# Patient Record
Sex: Female | Born: 1980
Health system: Southern US, Community
[De-identification: ages and names within clinical notes are randomized; demographics above are authoritative.]

## PROBLEM LIST (undated history)

## (undated) ENCOUNTER — Inpatient Hospital Stay (HOSPITAL_COMMUNITY): Payer: BC Managed Care – PPO

## (undated) DIAGNOSIS — R519 Headache, unspecified: Secondary | ICD-10-CM

## (undated) DIAGNOSIS — C801 Malignant (primary) neoplasm, unspecified: Secondary | ICD-10-CM

## (undated) DIAGNOSIS — Z8619 Personal history of other infectious and parasitic diseases: Secondary | ICD-10-CM

## (undated) DIAGNOSIS — R51 Headache: Secondary | ICD-10-CM

## (undated) DIAGNOSIS — R87629 Unspecified abnormal cytological findings in specimens from vagina: Secondary | ICD-10-CM

## (undated) HISTORY — DX: Headache, unspecified: R51.9

## (undated) HISTORY — DX: Headache: R51

## (undated) HISTORY — DX: Personal history of other infectious and parasitic diseases: Z86.19

## (undated) HISTORY — DX: Unspecified abnormal cytological findings in specimens from vagina: R87.629

---

## 2002-10-02 HISTORY — PX: PILONIDAL CYST EXCISION: SHX744

## 2003-04-09 ENCOUNTER — Ambulatory Visit (HOSPITAL_COMMUNITY): Admission: RE | Admit: 2003-04-09 | Discharge: 2003-04-09 | Payer: Self-pay | Admitting: General Surgery

## 2013-12-31 LAB — OB RESULTS CONSOLE HIV ANTIBODY (ROUTINE TESTING): HIV: NONREACTIVE

## 2013-12-31 LAB — OB RESULTS CONSOLE GC/CHLAMYDIA
Chlamydia: NEGATIVE
Gonorrhea: NEGATIVE

## 2013-12-31 LAB — OB RESULTS CONSOLE ABO/RH: "RH Type ": POSITIVE

## 2013-12-31 LAB — OB RESULTS CONSOLE ANTIBODY SCREEN: ANTIBODY SCREEN: NEGATIVE

## 2013-12-31 LAB — OB RESULTS CONSOLE HEPATITIS B SURFACE ANTIGEN: Hepatitis B Surface Ag: NEGATIVE

## 2013-12-31 LAB — OB RESULTS CONSOLE RPR: RPR: NONREACTIVE

## 2013-12-31 LAB — OB RESULTS CONSOLE RUBELLA ANTIBODY, IGM: Rubella: IMMUNE

## 2014-04-15 ENCOUNTER — Inpatient Hospital Stay (HOSPITAL_COMMUNITY): Admission: AD | Admit: 2014-04-15 | Payer: Self-pay | Source: Ambulatory Visit | Admitting: Obstetrics and Gynecology

## 2014-08-20 ENCOUNTER — Encounter (HOSPITAL_COMMUNITY): Payer: Self-pay | Admitting: *Deleted

## 2014-08-20 ENCOUNTER — Telehealth (HOSPITAL_COMMUNITY): Payer: Self-pay | Admitting: *Deleted

## 2014-08-20 LAB — OB RESULTS CONSOLE GBS: STREP GROUP B AG: NEGATIVE

## 2014-08-20 NOTE — Telephone Encounter (Signed)
Preadmission screen  

## 2014-08-21 ENCOUNTER — Encounter (HOSPITAL_COMMUNITY): Payer: Self-pay

## 2014-08-21 ENCOUNTER — Inpatient Hospital Stay (HOSPITAL_COMMUNITY)
Admission: RE | Admit: 2014-08-21 | Discharge: 2014-08-24 | DRG: 775 | Disposition: A | Discharge status: Home / Self Care | Payer: BC Managed Care – PPO | Source: Ambulatory Visit | Attending: Obstetrics and Gynecology | Admitting: Obstetrics and Gynecology

## 2014-08-21 DIAGNOSIS — Z3490 Encounter for supervision of normal pregnancy, unspecified, unspecified trimester: Secondary | ICD-10-CM | POA: Diagnosis present

## 2014-08-21 DIAGNOSIS — Z3A40 40 weeks gestation of pregnancy: Secondary | ICD-10-CM | POA: Diagnosis present

## 2014-08-21 LAB — CBC
HCT: 34.8 % — ABNORMAL LOW (ref 36.0–46.0)
Hemoglobin: 11.8 g/dL — ABNORMAL LOW (ref 12.0–15.0)
MCH: 33 pg (ref 26.0–34.0)
MCHC: 33.9 g/dL (ref 30.0–36.0)
MCV: 97.2 fL (ref 78.0–100.0)
Platelets: 193 10*3/uL (ref 150–400)
RBC: 3.58 MIL/uL — ABNORMAL LOW (ref 3.87–5.11)
RDW: 13.2 % (ref 11.5–15.5)
WBC: 9.3 10*3/uL (ref 4.0–10.5)

## 2014-08-21 LAB — TYPE AND SCREEN
ABO/RH(D): A POS
Antibody Screen: NEGATIVE

## 2014-08-21 LAB — OB RESULTS CONSOLE ABO/RH: RH Type: POSITIVE

## 2014-08-21 MED ORDER — LIDOCAINE HCL (PF) 1 % IJ SOLN
30.0000 mL | INTRAMUSCULAR | Status: DC | PRN
  Filled 2014-08-21: qty 30

## 2014-08-21 MED ORDER — CITRIC ACID-SODIUM CITRATE 334-500 MG/5ML PO SOLN: 30.0000 mL | ORAL | Status: DC | PRN

## 2014-08-21 MED ORDER — OXYTOCIN 40 UNITS IN LACTATED RINGERS INFUSION - SIMPLE MED: 62.5000 mL/h | INTRAVENOUS | Status: DC

## 2014-08-21 MED ORDER — OXYCODONE-ACETAMINOPHEN 5-325 MG PO TABS
1.0000 | ORAL_TABLET | ORAL | Status: DC | PRN
Start: 2014-08-21 — End: 2014-08-22

## 2014-08-21 MED ORDER — MISOPROSTOL 25 MCG QUARTER TABLET
25.0000 ug | ORAL_TABLET | ORAL | Status: DC | PRN
  Administered 2014-08-21: 25 ug via VAGINAL
  Filled 2014-08-21: qty 1
  Filled 2014-08-21: qty 0.25

## 2014-08-21 MED ORDER — ZOLPIDEM TARTRATE 5 MG PO TABS
5.0000 mg | ORAL_TABLET | Freq: Every evening | ORAL | Status: DC | PRN
  Filled 2014-08-21: qty 1

## 2014-08-21 MED ORDER — SODIUM CHLORIDE 0.9 % IJ SOLN
3.0000 mL | INTRAMUSCULAR | Status: DC | PRN
  Administered 2014-08-21 – 2014-08-22 (×2): 3 mL via INTRAVENOUS
  Filled 2014-08-21 (×2): qty 3

## 2014-08-21 MED ORDER — ACETAMINOPHEN 325 MG PO TABS: 650.0000 mg | ORAL_TABLET | ORAL | Status: DC | PRN

## 2014-08-21 MED ORDER — LACTATED RINGERS IV SOLN
INTRAVENOUS | Status: DC
Start: 2014-08-21 — End: 2014-08-22
  Administered 2014-08-21 – 2014-08-22 (×2): via INTRAVENOUS

## 2014-08-21 MED ORDER — TERBUTALINE SULFATE 1 MG/ML IJ SOLN
0.2500 mg | Freq: Once | INTRAMUSCULAR | Status: AC | PRN
Start: 2014-08-21 — End: 2014-08-21

## 2014-08-21 MED ORDER — OXYTOCIN BOLUS FROM INFUSION
500.0000 mL | INTRAVENOUS | Status: DC
  Administered 2014-08-22: 500 mL via INTRAVENOUS

## 2014-08-21 MED ORDER — OXYCODONE-ACETAMINOPHEN 5-325 MG PO TABS: 2.0000 | ORAL_TABLET | ORAL | Status: DC | PRN

## 2014-08-21 MED ORDER — LACTATED RINGERS IV SOLN: 500.0000 mL | INTRAVENOUS | Status: DC | PRN

## 2014-08-21 MED ORDER — BUTORPHANOL TARTRATE 1 MG/ML IJ SOLN
1.0000 mg | INTRAMUSCULAR | Status: DC | PRN
  Administered 2014-08-22 (×2): 1 mg via INTRAVENOUS
  Filled 2014-08-21 (×2): qty 1

## 2014-08-21 MED ORDER — FLEET ENEMA 7-19 GM/118ML RE ENEM: 1.0000 | ENEMA | Freq: Every day | RECTAL | Status: DC | PRN

## 2014-08-21 MED ORDER — ONDANSETRON HCL 4 MG/2ML IJ SOLN: 4.0000 mg | Freq: Four times a day (QID) | INTRAMUSCULAR | Status: DC | PRN

## 2014-08-21 NOTE — Plan of Care (Signed)
Problem: Phase I Progression Outcomes Goal: Assess per MD/Nurse,Routine-VS,FHR,UC,Head to Toe assess Outcome: Completed/Met Date Met:  08/21/14 Goal: Obtain and review prenatal records Outcome: Completed/Met Date Met:  08/21/14 Goal: Pain controlled with appropriate interventions Outcome: Completed/Met Date Met:  08/21/14 Goal: OOB as tolerated unless otherwise ordered Outcome: Completed/Met Date Met:  08/21/14 Goal: Tolerating diet Outcome: Completed/Met Date Met:  08/21/14 Goal: Adequate progression of labor Outcome: Completed/Met Date Met:  08/21/14 Goal: Medications/IV Fluids N/A Outcome: Completed/Met Date Met:  08/21/14 Goal: Induction meds as ordered Outcome: Completed/Met Date Met:  08/21/14 Goal: Appropriate patient level of comfort Outcome: Completed/Met Date Met:  08/21/14 Goal: Medical plan of care initiated within 2 hrs of admission Outcome: Completed/Met Date Met:  08/21/14 Goal: Other Phase I Outcomes/Goals Outcome: Completed/Met Date Met:  08/21/14

## 2014-08-22 ENCOUNTER — Encounter (HOSPITAL_COMMUNITY): Payer: Self-pay

## 2014-08-22 ENCOUNTER — Inpatient Hospital Stay (HOSPITAL_COMMUNITY): Payer: BC Managed Care – PPO | Admitting: Anesthesiology

## 2014-08-22 LAB — RPR

## 2014-08-22 LAB — ABO/RH: ABO/RH(D): A POS

## 2014-08-22 MED ORDER — MEDROXYPROGESTERONE ACETATE 150 MG/ML IM SUSP: 150.0000 mg | INTRAMUSCULAR | Status: DC | PRN

## 2014-08-22 MED ORDER — OXYCODONE-ACETAMINOPHEN 5-325 MG PO TABS: 2.0000 | ORAL_TABLET | ORAL | Status: DC | PRN

## 2014-08-22 MED ORDER — ONDANSETRON HCL 4 MG/2ML IJ SOLN: 4.0000 mg | INTRAMUSCULAR | Status: DC | PRN

## 2014-08-22 MED ORDER — DIPHENHYDRAMINE HCL 50 MG/ML IJ SOLN: 12.5000 mg | INTRAMUSCULAR | Status: DC | PRN

## 2014-08-22 MED ORDER — LANOLIN HYDROUS EX OINT
TOPICAL_OINTMENT | CUTANEOUS | Status: DC | PRN
Start: 2014-08-22 — End: 2014-08-24

## 2014-08-22 MED ORDER — TETANUS-DIPHTH-ACELL PERTUSSIS 5-2.5-18.5 LF-MCG/0.5 IM SUSP: 0.5000 mL | Freq: Once | INTRAMUSCULAR | Status: DC

## 2014-08-22 MED ORDER — OXYTOCIN 40 UNITS IN LACTATED RINGERS INFUSION - SIMPLE MED
1.0000 m[IU]/min | INTRAVENOUS | Status: DC
  Administered 2014-08-22: 2 m[IU]/min via INTRAVENOUS
  Filled 2014-08-22: qty 1000

## 2014-08-22 MED ORDER — DIBUCAINE 1 % RE OINT: 1.0000 "application " | TOPICAL_OINTMENT | RECTAL | Status: DC | PRN

## 2014-08-22 MED ORDER — PHENYLEPHRINE 40 MCG/ML (10ML) SYRINGE FOR IV PUSH (FOR BLOOD PRESSURE SUPPORT)
80.0000 ug | PREFILLED_SYRINGE | INTRAVENOUS | Status: DC | PRN
  Filled 2014-08-22: qty 2

## 2014-08-22 MED ORDER — FENTANYL 2.5 MCG/ML BUPIVACAINE 1/10 % EPIDURAL INFUSION (WH - ANES)
14.0000 mL/h | INTRAMUSCULAR | Status: DC | PRN
  Administered 2014-08-22: 14 mL/h via EPIDURAL
  Filled 2014-08-22: qty 125

## 2014-08-22 MED ORDER — LIDOCAINE-EPINEPHRINE (PF) 2 %-1:200000 IJ SOLN
INTRAMUSCULAR | Status: DC | PRN
  Administered 2014-08-22: 3 mL

## 2014-08-22 MED ORDER — SIMETHICONE 80 MG PO CHEW: 80.0000 mg | CHEWABLE_TABLET | ORAL | Status: DC | PRN

## 2014-08-22 MED ORDER — PHENYLEPHRINE 40 MCG/ML (10ML) SYRINGE FOR IV PUSH (FOR BLOOD PRESSURE SUPPORT)
80.0000 ug | PREFILLED_SYRINGE | INTRAVENOUS | Status: DC | PRN
  Filled 2014-08-22: qty 10
  Filled 2014-08-22: qty 2

## 2014-08-22 MED ORDER — SENNOSIDES-DOCUSATE SODIUM 8.6-50 MG PO TABS
2.0000 | ORAL_TABLET | ORAL | Status: DC
  Administered 2014-08-23 (×2): 2 via ORAL
  Filled 2014-08-22 (×2): qty 2

## 2014-08-22 MED ORDER — ONDANSETRON HCL 4 MG PO TABS: 4.0000 mg | ORAL_TABLET | ORAL | Status: DC | PRN

## 2014-08-22 MED ORDER — OXYCODONE-ACETAMINOPHEN 5-325 MG PO TABS
1.0000 | ORAL_TABLET | ORAL | Status: DC | PRN
  Administered 2014-08-23 – 2014-08-24 (×3): 1 via ORAL
  Filled 2014-08-22 (×3): qty 1

## 2014-08-22 MED ORDER — IBUPROFEN 600 MG PO TABS
600.0000 mg | ORAL_TABLET | Freq: Four times a day (QID) | ORAL | Status: DC
  Administered 2014-08-22 – 2014-08-24 (×7): 600 mg via ORAL
  Filled 2014-08-22 (×7): qty 1

## 2014-08-22 MED ORDER — DIPHENHYDRAMINE HCL 25 MG PO CAPS: 25.0000 mg | ORAL_CAPSULE | Freq: Four times a day (QID) | ORAL | Status: DC | PRN

## 2014-08-22 MED ORDER — EPHEDRINE 5 MG/ML INJ
10.0000 mg | INTRAVENOUS | Status: DC | PRN
  Filled 2014-08-22: qty 2

## 2014-08-22 MED ORDER — WITCH HAZEL-GLYCERIN EX PADS
1.0000 "application " | MEDICATED_PAD | CUTANEOUS | Status: DC | PRN
  Administered 2014-08-24: 1 via TOPICAL

## 2014-08-22 MED ORDER — MEASLES, MUMPS & RUBELLA VAC ~~LOC~~ INJ: 0.5000 mL | INJECTION | Freq: Once | SUBCUTANEOUS | Status: DC

## 2014-08-22 MED ORDER — BUPIVACAINE HCL (PF) 0.25 % IJ SOLN
INTRAMUSCULAR | Status: DC | PRN
  Administered 2014-08-22 (×2): 4 mL via EPIDURAL

## 2014-08-22 MED ORDER — BENZOCAINE-MENTHOL 20-0.5 % EX AERO
1.0000 "application " | INHALATION_SPRAY | CUTANEOUS | Status: DC | PRN
  Administered 2014-08-24: 1 via TOPICAL
  Filled 2014-08-22: qty 56

## 2014-08-22 MED ORDER — PRENATAL MULTIVITAMIN CH
1.0000 | ORAL_TABLET | Freq: Every day | ORAL | Status: DC
  Administered 2014-08-23: 1 via ORAL
  Filled 2014-08-22: qty 1

## 2014-08-22 MED ORDER — LACTATED RINGERS IV SOLN
500.0000 mL | Freq: Once | INTRAVENOUS | Status: AC
  Administered 2014-08-22: 500 mL via INTRAVENOUS

## 2014-08-22 MED ORDER — EPHEDRINE 5 MG/ML INJ
10.0000 mg | INTRAVENOUS | Status: DC | PRN
Start: 2014-08-22 — End: 2014-08-22
  Filled 2014-08-22: qty 2

## 2014-08-22 NOTE — Progress Notes (Signed)
Pt pushed great but was exhausted after 2.5hrs Offered vacuum assist vs primary c-section R/b/a of both d/w pt and husband including cephalohematoma, shoulder Dystocia, and failed attempt leading to c-section. Questions answered, informed consent. Bell vacuum applied and 2 gentle pulls performed  With next 2 pushes (pop off x 2).  After second pop off, episiotomy was cut  And vertex delivered spontaneously with next push. Infant placed on mom's chest and cord clamped and cut Placenta delivered spontaneous w/ 3VC.   2nd degree episiotomy repaired w/ 3-0 vicryl rapide.  Fundus firm.  EBL 400cc .

## 2014-08-22 NOTE — Progress Notes (Signed)
Pt uncomfortable - wants epidural  FHT reassuring Toco Q2-4 cvx 5cm/C/-2  A/P:  Continue pitocin Exp mngt

## 2014-08-22 NOTE — Plan of Care (Signed)
Problem: Consults Goal: Postpartum Patient Education (See Patient Education module for education specifics.)  Outcome: Completed/Met Date Met:  08/22/14 Goal: Skin Care Protocol Initiated - if Braden Score 18 or less If consults are not indicated, leave blank or document N/A  Outcome: Completed/Met Date Met:  08/22/14  Problem: Phase I Progression Outcomes Goal: Pain controlled with appropriate interventions Outcome: Completed/Met Date Met:  08/22/14 Goal: Voiding adequately Outcome: Completed/Met Date Met:  08/22/14 Goal: OOB as tolerated unless otherwise ordered Outcome: Completed/Met Date Met:  08/22/14 Goal: VS, stable, temp < 100.4 degrees F Outcome: Completed/Met Date Met:  08/22/14 Goal: Initial discharge plan identified Outcome: Completed/Met Date Met:  08/22/14 Goal: Other Phase I Outcomes/Goals Outcome: Completed/Met Date Met:  08/22/14  Problem: Phase II Progression Outcomes Goal: Pain controlled on oral analgesia Outcome: Completed/Met Date Met:  08/22/14

## 2014-08-22 NOTE — Progress Notes (Signed)
Pt comfortable with epidural  FHT reassuring w/ occasional variable/early decel Toco Q3 Cvx 8-9/C/0 Attempted IUPC but unable to insert completely b/c of advanced dilation/station  A/P:  Exp mngt

## 2014-08-22 NOTE — Anesthesia Procedure Notes (Signed)
Epidural Patient location during procedure: OB  Staffing Anesthesiologist: Fenna Semel, CHRIS Performed by: anesthesiologist   Preanesthetic Checklist Completed: patient identified, surgical consent, pre-op evaluation, timeout performed, IV checked, risks and benefits discussed and monitors and equipment checked  Epidural Patient position: sitting Prep: site prepped and draped and DuraPrep Patient monitoring: heart rate, cardiac monitor, continuous pulse ox and blood pressure Approach: midline Location: L3-L4 Injection technique: LOR saline  Needle:  Needle type: Tuohy  Needle gauge: 17 G Needle length: 9 cm Needle insertion depth: 5 cm Catheter type: closed end flexible Catheter size: 19 Gauge Catheter at skin depth: 12 cm Test dose: negative and 2% lidocaine with Epi 1:200 K  Assessment Events: blood not aspirated, injection not painful, no injection resistance, negative IV test and no paresthesia  Additional Notes H+P and labs checked, risks and benefits discussed with the patient, consent obtained, procedure tolerated well and without complications.  Reason for block:procedure for pain

## 2014-08-22 NOTE — Plan of Care (Signed)
Problem: Phase II Progression Outcomes Goal: Tolerating diet Outcome: Completed/Met Date Met:  08/22/14

## 2014-08-22 NOTE — Anesthesia Preprocedure Evaluation (Signed)
Anesthesia Evaluation  Patient identified by MRN, date of birth, ID band Patient awake    Reviewed: Allergy & Precautions, H&P , NPO status , Patient's Chart, lab work & pertinent test results  History of Anesthesia Complications Negative for: history of anesthetic complications  Airway Mallampati: II  TM Distance: >3 FB Neck ROM: Full    Dental  (+) Teeth Intact   Pulmonary neg pulmonary ROS,  breath sounds clear to auscultation        Cardiovascular negative cardio ROS  Rhythm:Regular     Neuro/Psych  Headaches, negative psych ROS   GI/Hepatic negative GI ROS, Neg liver ROS,   Endo/Other  negative endocrine ROS  Renal/GU negative Renal ROS     Musculoskeletal   Abdominal   Peds  Hematology  (+) anemia ,   Anesthesia Other Findings   Reproductive/Obstetrics                             Anesthesia Physical Anesthesia Plan  ASA: II  Anesthesia Plan: Epidural   Post-op Pain Management:    Induction:   Airway Management Planned:   Additional Equipment:   Intra-op Plan:   Post-operative Plan:   Informed Consent: I have reviewed the patients History and Physical, chart, labs and discussed the procedure including the risks, benefits and alternatives for the proposed anesthesia with the patient or authorized representative who has indicated his/her understanding and acceptance.   Dental advisory given  Plan Discussed with: Anesthesiologist  Anesthesia Plan Comments:         Anesthesia Quick Evaluation

## 2014-08-22 NOTE — H&P (Signed)
Lori Best is a 33 y.o. female G1P0 @ 40+1 presenting for postdates IOL.  No vb or lof. Pregnancy uncomplicated.  Pt received one dose cytotec overnight  History OB History    Gravida Para Term Preterm AB TAB SAB Ectopic Multiple Living   1              Past Medical History  Diagnosis Date  . Vaginal Pap smear, abnormal   . Hx of varicella   . Headache     migraines   Past Surgical History  Procedure Laterality Date  . Pilonidal cyst excision  2004   Family History: family history includes Cancer in her mother and paternal grandmother; Hypertension in her father and paternal grandmother; Kidney Stones in her mother. Social History:  reports that she has never smoked. She has never used smokeless tobacco. She reports that she does not drink alcohol or use illicit drugs.   Prenatal Transfer Tool  Maternal Diabetes: No Genetic Screening: Normal Maternal Ultrasounds/Referrals: Normal Fetal Ultrasounds or other Referrals:  None Maternal Substance Abuse:  No Significant Maternal Medications:  None Significant Maternal Lab Results:  None Other Comments:  None  ROS  Dilation: 3 Effacement (%): 60 Station: -2 Exam by:: Dr. Julien Girt Blood pressure 124/88, pulse 86, temperature 98.4 F (36.9 C), temperature source Oral, resp. rate 20, height 5\' 2"  (1.575 m), weight 76.204 kg (168 lb). Exam Physical Exam  gen - NAD Abd - gravid, NT,  EFW 7 1/2# Ext - NT, no edema Cvx 3/60/-2 AROM - clear  Prenatal labs: ABO, Rh: A/Positive/-- (11/20 2221) Antibody: NEG (11/20 1955) Rubella: Immune (04/01 0000) RPR: NON REAC (11/20 1955)  HBsAg: Negative (04/01 0000)  HIV: Non-reactive (04/01 0000)  GBS: Negative (11/19 0000)   Assessment/Plan: Admit Ext mngt Pitocin  Epidural prn   Khadeejah Castner 08/22/2014, 7:33 AM

## 2014-08-23 LAB — CBC
HEMATOCRIT: 29.8 % — AB (ref 36.0–46.0)
HEMOGLOBIN: 10.2 g/dL — AB (ref 12.0–15.0)
MCH: 33.4 pg (ref 26.0–34.0)
MCHC: 34.2 g/dL (ref 30.0–36.0)
MCV: 97.7 fL (ref 78.0–100.0)
Platelets: 174 10*3/uL (ref 150–400)
RBC: 3.05 MIL/uL — ABNORMAL LOW (ref 3.87–5.11)
RDW: 13.3 % (ref 11.5–15.5)
WBC: 14.5 10*3/uL — ABNORMAL HIGH (ref 4.0–10.5)

## 2014-08-23 NOTE — Progress Notes (Signed)
Post Partum Day 1 Subjective: no complaints, up ad lib and tolerating PO  Objective: Blood pressure 122/83, pulse 96, temperature 98.2 F (36.8 C), temperature source Oral, resp. rate 18, height 5\' 2"  (1.575 m), weight 76.204 kg (168 lb), SpO2 97 %, unknown if currently breastfeeding.  Physical Exam:  General: alert and cooperative Lochia: appropriate Uterine Fundus: firm Incision: n/a DVT Evaluation: No evidence of DVT seen on physical exam.   Recent Labs  08/21/14 1955 08/23/14 0557  HGB 11.8* 10.2*  HCT 34.8* 29.8*    Assessment/Plan: Plan for discharge tomorrow, Breastfeeding and Circumcision prior to discharge   LOS: 2 days   Lori Best 08/23/2014, 7:17 AM

## 2014-08-23 NOTE — Lactation Note (Signed)
This note was copied from the chart of [place]. Lactation Consultation Note  Patient Name: Lori Best [id number] Date: 08/23/2014 Reason for consult: Initial assessment of this mom and baby, now 27 hours postpartum.  Mom is a primipara and is exclusively breastfeeding her newborn.  LC demonstrated hand expression, and reviewed and encouraged STS and cue feedings.  Mom has just finished a 10 minute feeding.  LATCH scores of 8 and 9 have been recorded by RN assessment.  Baby already having more than minimum output.  Mom encouraged to feed baby 8-12 times/24 hours and with feeding cues. LC encouraged review of Baby and Me pp 9, 14 and 20-25 for STS and BF information. LC provided [employer] brochure and reviewed [facility] services and list of community and web site resources.    Maternal Data Formula Feeding for Exclusion: No Has patient been taught Hand Expression?:  (LC demonstrated and mom says she did " a lot of reading to prepare for BF") Does the patient have breastfeeding experience prior to this delivery?: No  Feeding Feeding Type: Breast Fed Length of feed: 17 min  LATCH Score/Interventions Latch: Grasps breast easily, tongue down, lips flanged, rhythmical sucking.  Audible Swallowing: A few with stimulation Intervention(s): Hand expression;Skin to skin  Type of Nipple: Everted at rest and after stimulation  Comfort (Breast/Nipple): Soft / non-tender     Hold (Positioning): No assistance needed to correctly position infant at breast.  LATCH Score: 9 (most recent assessment, per RN)  Lactation Tools Discussed/Used   STS, hand expression, cue feedings  Consult Status Consult Status: Follow-up Date: 08/24/14 Follow-up type: In-patient    Lori Best [facility] 08/23/2014, 10:15 PM

## 2014-08-23 NOTE — Anesthesia Postprocedure Evaluation (Signed)
  Anesthesia Post-op Note  Anesthesia Post Note  Patient: Lori Best  Procedure(s) Performed: * No procedures listed *  Anesthesia type: Epidural  Patient location: Mother/Baby  Post pain: Pain level controlled  Post assessment: Post-op Vital signs reviewed  Last Vitals:  Filed Vitals:   08/23/14 0503  BP: 122/83  Pulse: 96  Temp: 36.8 C  Resp: 18    Post vital signs: Reviewed  Level of consciousness:alert  Complications: No apparent anesthesia complications

## 2014-08-24 MED ORDER — OXYCODONE-ACETAMINOPHEN 5-325 MG PO TABS: 1.0000 | ORAL_TABLET | ORAL | Status: DC | PRN

## 2014-08-24 MED ORDER — IBUPROFEN 600 MG PO TABS: 600.0000 mg | ORAL_TABLET | Freq: Four times a day (QID) | ORAL | Status: DC

## 2014-08-24 NOTE — Discharge Summary (Signed)
Obstetric Discharge Summary Reason for Admission: induction of labor Prenatal Procedures: none Intrapartum Procedures: spontaneous vaginal delivery and vacuum Postpartum Procedures: none Complications-Operative and Postpartum: 2nd degree perineal laceration HEMOGLOBIN  Date Value Ref Range Status  08/23/2014 10.2* 12.0 - 15.0 g/dL Final   HCT  Date Value Ref Range Status  08/23/2014 29.8* 36.0 - 46.0 % Final    Physical Exam:  General: alert, cooperative and appears stated age 33: appropriate Uterine Fundus: firm Incision: healing well, no significant drainage, no dehiscence DVT Evaluation: No evidence of DVT seen on physical exam. Negative Homan's sign. No cords or calf tenderness.  Discharge Diagnoses: Term Pregnancy-delivered  Discharge Information: Date: 08/24/2014 Activity: pelvic rest Diet: routine Medications: PNV, Ibuprofen and Percocet Condition: stable Instructions: refer to practice specific booklet Discharge to: home   Newborn Data: Live born female  Birth Weight: 8 lb 1.2 oz (3663 g) APGAR: 8, 9  Home with mother.  Lori Best, [place] 08/24/2014, 8:14 AM

## 2014-08-24 NOTE — Plan of Care (Signed)
Problem: Phase II Progression Outcomes Goal: Progress activity as tolerated unless otherwise ordered Outcome: Completed/Met Date Met:  08/24/14 Goal: Afebrile, VS remain stable Outcome: Completed/Met Date Met:  08/24/14 Goal: Other Phase II Outcomes/Goals Outcome: Completed/Met Date Met:  08/24/14  Problem: Discharge Progression Outcomes Goal: Tolerating diet Outcome: Completed/Met Date Met:  63/81/77 Goal: Complications resolved/controlled Outcome: Completed/Met Date Met:  08/24/14 Goal: Pain controlled with appropriate interventions Outcome: Completed/Met Date Met:  08/24/14

## 2014-08-24 NOTE — Progress Notes (Signed)
Post Partum Day 2  Subjective: no complaints, up ad lib, voiding, tolerating PO and + flatus.  Wants circ today.  Objective: Blood pressure 108/62, pulse 76, temperature 98.1 F (36.7 C), temperature source Oral, resp. rate 18, height 5\' 2"  (1.575 m), weight 168 lb (76.204 kg), SpO2 98 %, unknown if currently breastfeeding.  Physical Exam:  General: alert, cooperative and appears stated age Lochia: appropriate Uterine Fundus: firm Incision: healing well, no significant drainage, no dehiscence DVT Evaluation: No evidence of DVT seen on physical exam. Negative Homan's sign. No cords or calf tenderness.   Recent Labs  08/21/14 1955 08/23/14 0557  HGB 11.8* 10.2*  HCT 34.8* 29.8*    Assessment/Plan: Discharge home, Breastfeeding and Circumcision prior to discharge Counseled for circ including risk of bleeding, infection, and scarring.  All questions were answered.     LOS: 3 days   Cap Massi 08/24/2014, 8:09 AM

## 2014-08-24 NOTE — Discharge Instructions (Signed)
Call MD for T>100.4, heavy vaginal bleeding, intractable nausea and/or vomiting, or respiratory distress.  Call office to schedule postpartum visit in 4-6 weeks.  No driving while taking narcotics.

## 2014-09-22 ENCOUNTER — Other Ambulatory Visit: Payer: Self-pay | Admitting: Obstetrics and Gynecology

## 2014-09-24 LAB — CYTOLOGY - PAP

## 2016-06-19 ENCOUNTER — Other Ambulatory Visit: Payer: Self-pay | Admitting: Obstetrics and Gynecology

## 2016-06-19 DIAGNOSIS — Z803 Family history of malignant neoplasm of breast: Secondary | ICD-10-CM

## 2016-06-29 ENCOUNTER — Ambulatory Visit
Admission: RE | Admit: 2016-06-29 | Discharge: 2016-06-29 | Disposition: A | Discharge status: Home / Self Care | Payer: Managed Care, Other (non HMO) | Source: Ambulatory Visit | Attending: Obstetrics and Gynecology | Admitting: Obstetrics and Gynecology

## 2016-06-29 DIAGNOSIS — Z803 Family history of malignant neoplasm of breast: Secondary | ICD-10-CM

## 2016-06-29 MED ORDER — GADOBENATE DIMEGLUMINE 529 MG/ML IV SOLN
12.0000 mL | Freq: Once | INTRAVENOUS | Status: AC | PRN
  Administered 2016-06-29: 12 mL via INTRAVENOUS

## 2016-07-04 ENCOUNTER — Other Ambulatory Visit: Payer: Self-pay | Admitting: Obstetrics and Gynecology

## 2016-07-04 DIAGNOSIS — R9389 Abnormal findings on diagnostic imaging of other specified body structures: Secondary | ICD-10-CM

## 2016-07-06 ENCOUNTER — Other Ambulatory Visit: Payer: Managed Care, Other (non HMO)

## 2016-07-13 ENCOUNTER — Other Ambulatory Visit: Payer: Managed Care, Other (non HMO)

## 2016-07-14 ENCOUNTER — Ambulatory Visit
Admission: RE | Admit: 2016-07-14 | Discharge: 2016-07-14 | Disposition: A | Discharge status: Home / Self Care | Payer: Managed Care, Other (non HMO) | Source: Ambulatory Visit | Attending: Obstetrics and Gynecology | Admitting: Obstetrics and Gynecology

## 2016-07-14 ENCOUNTER — Other Ambulatory Visit: Payer: Self-pay | Admitting: Obstetrics and Gynecology

## 2016-07-14 DIAGNOSIS — R9389 Abnormal findings on diagnostic imaging of other specified body structures: Secondary | ICD-10-CM

## 2016-07-14 MED ORDER — GADOBENATE DIMEGLUMINE 529 MG/ML IV SOLN
12.0000 mL | Freq: Once | INTRAVENOUS | Status: AC | PRN
  Administered 2016-07-14: 12 mL via INTRAVENOUS

## 2016-10-02 NOTE — L&D Delivery Note (Signed)
Delivery Note At 2:07 PM a viable female was delivered via Vaginal, Spontaneous Delivery (Presentation: ROA;  ).  APGAR: 9, 9; weight pending   Placenta status: routine .  Cord: WNL  with the following complications: none  Anesthesia: CLE  Episiotomy: None Lacerations: 2nd degree;Perineal Suture Repair: 3.0 vicryl Est. Blood Loss (mL):     It's a boy "Lori Best"! Mom to postpartum.  Baby to Couplet care / Skin to Skin.  [name] 06/15/2017, 2:36 PM

## 2016-11-17 LAB — OB RESULTS CONSOLE GC/CHLAMYDIA
CHLAMYDIA, DNA PROBE: NEGATIVE
Gonorrhea: NEGATIVE

## 2016-11-17 LAB — OB RESULTS CONSOLE HIV ANTIBODY (ROUTINE TESTING): HIV: NONREACTIVE

## 2016-11-17 LAB — OB RESULTS CONSOLE RUBELLA ANTIBODY, IGM: Rubella: IMMUNE

## 2016-11-17 LAB — OB RESULTS CONSOLE ANTIBODY SCREEN: Antibody Screen: NEGATIVE

## 2016-11-17 LAB — OB RESULTS CONSOLE GBS: GBS: POSITIVE

## 2016-11-17 LAB — OB RESULTS CONSOLE ABO/RH: RH Type: POSITIVE

## 2016-11-17 LAB — OB RESULTS CONSOLE RPR: RPR: NONREACTIVE

## 2016-11-17 LAB — OB RESULTS CONSOLE HEPATITIS B SURFACE ANTIGEN: HEP B S AG: NEGATIVE

## 2017-06-12 ENCOUNTER — Encounter (HOSPITAL_COMMUNITY): Payer: Self-pay | Admitting: *Deleted

## 2017-06-12 ENCOUNTER — Telehealth (HOSPITAL_COMMUNITY): Payer: Self-pay | Admitting: *Deleted

## 2017-06-12 ENCOUNTER — Inpatient Hospital Stay (HOSPITAL_COMMUNITY): Admission: RE | Admit: 2017-06-12 | Payer: Managed Care, Other (non HMO) | Source: Ambulatory Visit

## 2017-06-12 NOTE — Telephone Encounter (Signed)
Preadmission screen  

## 2017-06-13 ENCOUNTER — Encounter (HOSPITAL_COMMUNITY): Payer: Self-pay | Admitting: *Deleted

## 2017-06-14 NOTE — H&P (Signed)
Lori Best is a 36 y.o. female presenting for elective IOL with favorable cervix. She has a h/o HGSIL pap, is AMA, and is GBS +. OB History    Gravida Para Term Preterm AB Living   2 1 1     1    SAB TAB Ectopic Multiple Live Births         0 1     Past Medical History:  Diagnosis Date  . Headache    migraines  . Hx of varicella   . Vaginal Pap smear, abnormal    Past Surgical History:  Procedure Laterality Date  . PILONIDAL CYST EXCISION  2004   Family History: family history includes Birth defects in her son; Cancer in her mother and paternal grandmother; Hypertension in her father and paternal grandmother; Kidney Stones in her mother. Social History:  reports that she has never smoked. She has never used smokeless tobacco. She reports that she does not drink alcohol or use drugs.     Maternal Diabetes: No Genetic Screening: Normal Maternal Ultrasounds/Referrals: Normal Fetal Ultrasounds or other Referrals:  None Maternal Substance Abuse:  No Significant Maternal Medications:  None Significant Maternal Lab Results:  None Other Comments:  None  ROS History   Last menstrual period 09/11/2016, unknown if currently breastfeeding. Exam Physical Exam  (from clinic) NAD, A&O NWOB Abd soft, nondistended, gravid SVE 3/60/-2  Prenatal labs: ABO, Rh: A/Positive/-- (02/16 0000) Antibody: Negative (02/16 0000) Rubella: Immune (02/16 0000) RPR: Nonreactive (02/16 0000)  HBsAg: Negative (02/16 0000)  HIV: Non-reactive (02/16 0000)  GBS: Positive (02/16 0000)   Assessment/Plan: 36 yo G2P1001 @ 40.0wg presenting for IOL with a favorable cervix. She has a h/o HGSIL pap and is for pp pap smear. She is AMA with a normal panorama (female fetus). She has a h/o migraine HA. Plan pitocin/AROM. She is GBS + with a PCN allergy. Plan for clindamycin (sensitive).   [name] 06/14/2017, 11:38 AM

## 2017-06-15 ENCOUNTER — Inpatient Hospital Stay (HOSPITAL_COMMUNITY): Payer: 59 | Admitting: Anesthesiology

## 2017-06-15 ENCOUNTER — Inpatient Hospital Stay (HOSPITAL_COMMUNITY)
Admission: RE | Admit: 2017-06-15 | Discharge: 2017-06-16 | DRG: 775 | Disposition: A | Discharge status: Home / Self Care | Payer: 59 | Source: Ambulatory Visit | Attending: Obstetrics and Gynecology | Admitting: Obstetrics and Gynecology

## 2017-06-15 ENCOUNTER — Encounter (HOSPITAL_COMMUNITY): Payer: Self-pay

## 2017-06-15 DIAGNOSIS — Z3A40 40 weeks gestation of pregnancy: Secondary | ICD-10-CM

## 2017-06-15 DIAGNOSIS — O99824 Streptococcus B carrier state complicating childbirth: Secondary | ICD-10-CM | POA: Diagnosis present

## 2017-06-15 DIAGNOSIS — Z880 Allergy status to penicillin: Secondary | ICD-10-CM | POA: Diagnosis not present

## 2017-06-15 DIAGNOSIS — Z3490 Encounter for supervision of normal pregnancy, unspecified, unspecified trimester: Secondary | ICD-10-CM

## 2017-06-15 DIAGNOSIS — O26893 Other specified pregnancy related conditions, third trimester: Secondary | ICD-10-CM | POA: Diagnosis present

## 2017-06-15 LAB — COMPREHENSIVE METABOLIC PANEL
ALBUMIN: 2.9 g/dL — AB (ref 3.5–5.0)
ALT: 19 U/L (ref 14–54)
ANION GAP: 11 (ref 5–15)
AST: 31 U/L (ref 15–41)
Alkaline Phosphatase: 92 U/L (ref 38–126)
BUN: 9 mg/dL (ref 6–20)
CHLORIDE: 104 mmol/L (ref 101–111)
CO2: 20 mmol/L — AB (ref 22–32)
Calcium: 9 mg/dL (ref 8.9–10.3)
Creatinine, Ser: 0.64 mg/dL (ref 0.44–1.00)
GFR calc Af Amer: >60 mL/min (ref >60)
GFR calc non Af Amer: >60 mL/min (ref >60)
GLUCOSE: 95 mg/dL (ref 65–99)
POTASSIUM: 3.9 mmol/L (ref 3.5–5.1)
SODIUM: 135 mmol/L (ref 135–145)
Total Bilirubin: 0.2 mg/dL — ABNORMAL LOW (ref 0.3–1.2)
Total Protein: 6.3 g/dL — ABNORMAL LOW (ref 6.5–8.1)

## 2017-06-15 LAB — PROTEIN / CREATININE RATIO, URINE
Creatinine, Urine: <10 mg/dL
Total Protein, Urine: <6 mg/dL

## 2017-06-15 LAB — CBC
HCT: 34.6 % — ABNORMAL LOW (ref 36.0–46.0)
Hemoglobin: 11.7 g/dL — ABNORMAL LOW (ref 12.0–15.0)
MCH: 32.2 pg (ref 26.0–34.0)
MCHC: 33.8 g/dL (ref 30.0–36.0)
MCV: 95.3 fL (ref 78.0–100.0)
Platelets: 200 10*3/uL (ref 150–400)
RBC: 3.63 MIL/uL — ABNORMAL LOW (ref 3.87–5.11)
RDW: 13.3 % (ref 11.5–15.5)
WBC: 10.3 10*3/uL (ref 4.0–10.5)

## 2017-06-15 LAB — RPR: RPR: NONREACTIVE

## 2017-06-15 LAB — TYPE AND SCREEN
ABO/RH(D): A POS
ANTIBODY SCREEN: NEGATIVE

## 2017-06-15 MED ORDER — ONDANSETRON HCL 4 MG/2ML IJ SOLN: 4.0000 mg | Freq: Four times a day (QID) | INTRAMUSCULAR | Status: DC | PRN

## 2017-06-15 MED ORDER — OXYTOCIN 40 UNITS IN LACTATED RINGERS INFUSION - SIMPLE MED
1.0000 m[IU]/min | INTRAVENOUS | Status: DC
  Administered 2017-06-15: 2 m[IU]/min via INTRAVENOUS

## 2017-06-15 MED ORDER — EPHEDRINE 5 MG/ML INJ
10.0000 mg | INTRAVENOUS | Status: DC | PRN
  Filled 2017-06-15: qty 2

## 2017-06-15 MED ORDER — WITCH HAZEL-GLYCERIN EX PADS: 1.0000 "application " | MEDICATED_PAD | CUTANEOUS | Status: DC | PRN

## 2017-06-15 MED ORDER — PHENYLEPHRINE 40 MCG/ML (10ML) SYRINGE FOR IV PUSH (FOR BLOOD PRESSURE SUPPORT)
80.0000 ug | PREFILLED_SYRINGE | INTRAVENOUS | Status: DC | PRN
  Filled 2017-06-15: qty 5

## 2017-06-15 MED ORDER — TETANUS-DIPHTH-ACELL PERTUSSIS 5-2.5-18.5 LF-MCG/0.5 IM SUSP: 0.5000 mL | Freq: Once | INTRAMUSCULAR | Status: DC

## 2017-06-15 MED ORDER — FLEET ENEMA 7-19 GM/118ML RE ENEM: 1.0000 | ENEMA | RECTAL | Status: DC | PRN

## 2017-06-15 MED ORDER — ZOLPIDEM TARTRATE 5 MG PO TABS: 5.0000 mg | ORAL_TABLET | Freq: Every evening | ORAL | Status: DC | PRN

## 2017-06-15 MED ORDER — LACTATED RINGERS IV SOLN: 500.0000 mL | INTRAVENOUS | Status: DC | PRN

## 2017-06-15 MED ORDER — DIPHENHYDRAMINE HCL 25 MG PO CAPS: 25.0000 mg | ORAL_CAPSULE | Freq: Four times a day (QID) | ORAL | Status: DC | PRN

## 2017-06-15 MED ORDER — SENNOSIDES-DOCUSATE SODIUM 8.6-50 MG PO TABS: 2.0000 | ORAL_TABLET | ORAL | Status: DC

## 2017-06-15 MED ORDER — PRENATAL MULTIVITAMIN CH: 1.0000 | ORAL_TABLET | Freq: Every day | ORAL | Status: DC

## 2017-06-15 MED ORDER — SENNOSIDES-DOCUSATE SODIUM 8.6-50 MG PO TABS
2.0000 | ORAL_TABLET | ORAL | Status: DC
  Administered 2017-06-15: 2 via ORAL
  Filled 2017-06-15: qty 2

## 2017-06-15 MED ORDER — ACETAMINOPHEN 325 MG PO TABS: 650.0000 mg | ORAL_TABLET | ORAL | Status: DC | PRN

## 2017-06-15 MED ORDER — SIMETHICONE 80 MG PO CHEW: 80.0000 mg | CHEWABLE_TABLET | ORAL | Status: DC | PRN

## 2017-06-15 MED ORDER — DIPHENHYDRAMINE HCL 50 MG/ML IJ SOLN: 12.5000 mg | INTRAMUSCULAR | Status: DC | PRN

## 2017-06-15 MED ORDER — LIDOCAINE HCL (PF) 1 % IJ SOLN
INTRAMUSCULAR | Status: DC | PRN
  Administered 2017-06-15: 6 mL via EPIDURAL
  Administered 2017-06-15: 7 mL via EPIDURAL

## 2017-06-15 MED ORDER — LIDOCAINE HCL (PF) 1 % IJ SOLN
30.0000 mL | INTRAMUSCULAR | Status: DC | PRN
  Filled 2017-06-15: qty 30

## 2017-06-15 MED ORDER — PHENYLEPHRINE 40 MCG/ML (10ML) SYRINGE FOR IV PUSH (FOR BLOOD PRESSURE SUPPORT)
80.0000 ug | PREFILLED_SYRINGE | INTRAVENOUS | Status: DC | PRN
  Filled 2017-06-15: qty 5
  Filled 2017-06-15: qty 10

## 2017-06-15 MED ORDER — IBUPROFEN 600 MG PO TABS
600.0000 mg | ORAL_TABLET | Freq: Four times a day (QID) | ORAL | Status: DC | PRN
  Administered 2017-06-15: 600 mg via ORAL
  Filled 2017-06-15: qty 1

## 2017-06-15 MED ORDER — ONDANSETRON HCL 4 MG PO TABS: 4.0000 mg | ORAL_TABLET | ORAL | Status: DC | PRN

## 2017-06-15 MED ORDER — OXYCODONE HCL 5 MG PO TABS: 5.0000 mg | ORAL_TABLET | ORAL | Status: DC | PRN

## 2017-06-15 MED ORDER — ONDANSETRON HCL 4 MG/2ML IJ SOLN: 4.0000 mg | INTRAMUSCULAR | Status: DC | PRN

## 2017-06-15 MED ORDER — IBUPROFEN 600 MG PO TABS
600.0000 mg | ORAL_TABLET | Freq: Four times a day (QID) | ORAL | Status: DC
  Administered 2017-06-15 – 2017-06-16 (×3): 600 mg via ORAL
  Filled 2017-06-15 (×3): qty 1

## 2017-06-15 MED ORDER — OXYCODONE HCL 5 MG PO TABS: 10.0000 mg | ORAL_TABLET | ORAL | Status: DC | PRN

## 2017-06-15 MED ORDER — LACTATED RINGERS IV SOLN
INTRAVENOUS | Status: DC
  Administered 2017-06-15: 12:00:00 via INTRAVENOUS

## 2017-06-15 MED ORDER — LACTATED RINGERS IV SOLN
500.0000 mL | Freq: Once | INTRAVENOUS | Status: AC
  Administered 2017-06-15: 500 mL via INTRAVENOUS

## 2017-06-15 MED ORDER — TERBUTALINE SULFATE 1 MG/ML IJ SOLN
0.2500 mg | Freq: Once | INTRAMUSCULAR | Status: DC | PRN
  Filled 2017-06-15: qty 1

## 2017-06-15 MED ORDER — OXYCODONE-ACETAMINOPHEN 5-325 MG PO TABS: 1.0000 | ORAL_TABLET | ORAL | Status: DC | PRN

## 2017-06-15 MED ORDER — IBUPROFEN 600 MG PO TABS: 600.0000 mg | ORAL_TABLET | Freq: Four times a day (QID) | ORAL | Status: DC

## 2017-06-15 MED ORDER — DIBUCAINE 1 % RE OINT: 1.0000 "application " | TOPICAL_OINTMENT | RECTAL | Status: DC | PRN

## 2017-06-15 MED ORDER — INFLUENZA VAC SPLIT QUAD 0.5 ML IM SUSY
0.5000 mL | PREFILLED_SYRINGE | INTRAMUSCULAR | Status: AC
  Administered 2017-06-16: 0.5 mL via INTRAMUSCULAR
  Filled 2017-06-15: qty 0.5

## 2017-06-15 MED ORDER — PRENATAL MULTIVITAMIN CH
1.0000 | ORAL_TABLET | Freq: Every day | ORAL | Status: DC
  Administered 2017-06-16: 1 via ORAL
  Filled 2017-06-15: qty 1

## 2017-06-15 MED ORDER — COCONUT OIL OIL: 1.0000 "application " | TOPICAL_OIL | Status: DC | PRN

## 2017-06-15 MED ORDER — OXYCODONE HCL 5 MG PO TABS
10.0000 mg | ORAL_TABLET | ORAL | Status: DC | PRN
Start: 2017-06-15 — End: 2017-06-15

## 2017-06-15 MED ORDER — OXYTOCIN 40 UNITS IN LACTATED RINGERS INFUSION - SIMPLE MED: 1.0000 m[IU]/min | INTRAVENOUS | Status: DC

## 2017-06-15 MED ORDER — CLINDAMYCIN PHOSPHATE 900 MG/50ML IV SOLN: 900.0000 mg | Freq: Three times a day (TID) | INTRAVENOUS | Status: DC

## 2017-06-15 MED ORDER — BENZOCAINE-MENTHOL 20-0.5 % EX AERO: 1.0000 "application " | INHALATION_SPRAY | CUTANEOUS | Status: DC | PRN

## 2017-06-15 MED ORDER — FENTANYL 2.5 MCG/ML BUPIVACAINE 1/10 % EPIDURAL INFUSION (WH - ANES)
14.0000 mL/h | INTRAMUSCULAR | Status: DC | PRN
  Administered 2017-06-15: 14 mL/h via EPIDURAL
  Filled 2017-06-15: qty 100

## 2017-06-15 MED ORDER — OXYTOCIN 40 UNITS IN LACTATED RINGERS INFUSION - SIMPLE MED
2.5000 [IU]/h | INTRAVENOUS | Status: DC
  Filled 2017-06-15: qty 1000

## 2017-06-15 MED ORDER — OXYCODONE-ACETAMINOPHEN 5-325 MG PO TABS: 2.0000 | ORAL_TABLET | ORAL | Status: DC | PRN

## 2017-06-15 MED ORDER — SOD CITRATE-CITRIC ACID 500-334 MG/5ML PO SOLN: 30.0000 mL | ORAL | Status: DC | PRN

## 2017-06-15 MED ORDER — OXYTOCIN BOLUS FROM INFUSION
500.0000 mL | Freq: Once | INTRAVENOUS | Status: AC
  Administered 2017-06-15: 500 mL via INTRAVENOUS

## 2017-06-15 MED ORDER — CLINDAMYCIN PHOSPHATE 900 MG/50ML IV SOLN
900.0000 mg | Freq: Three times a day (TID) | INTRAVENOUS | Status: DC
  Administered 2017-06-15: 900 mg via INTRAVENOUS
  Filled 2017-06-15 (×3): qty 50

## 2017-06-15 MED ORDER — BENZOCAINE-MENTHOL 20-0.5 % EX AERO
1.0000 | INHALATION_SPRAY | CUTANEOUS | Status: DC | PRN
Start: 2017-06-15 — End: 2017-06-16

## 2017-06-15 NOTE — Anesthesia Postprocedure Evaluation (Signed)
Anesthesia Post Note  Patient: Lori Best  Procedure(s) Performed: * No procedures listed *     Patient location during evaluation: Mother Baby Anesthesia Type: Epidural Level of consciousness: awake and alert and oriented Pain management: satisfactory to patient Vital Signs Assessment: post-procedure vital signs reviewed and stable Respiratory status: spontaneous breathing and nonlabored ventilation Cardiovascular status: stable Postop Assessment: no headache, no backache, no signs of nausea or vomiting, adequate PO intake and patient able to bend at knees (patient up walking) Anesthetic complications: no    Last Vitals:  Vitals:   06/15/17 1600 06/15/17 1700  BP: (!) 142/85 124/79  Pulse: 86 (!) 101  Resp: 18 16  Temp: 36.7 C 36.6 C    Last Pain:  Vitals:   06/15/17 1700  TempSrc: Oral  PainSc: 3    Pain Goal: Patients Stated Pain Goal: 1 (06/15/17 1700)               Willa Rough

## 2017-06-15 NOTE — Anesthesia Procedure Notes (Signed)
Epidural Patient location during procedure: OB Start time: 06/15/2017 11:19 AM End time: 06/15/2017 11:23 AM  Staffing Anesthesiologist: Lyn Hollingshead Performed: anesthesiologist   Preanesthetic Checklist Completed: patient identified, site marked, surgical consent, pre-op evaluation, timeout performed, IV checked, risks and benefits discussed and monitors and equipment checked  Epidural Patient position: sitting Prep: site prepped and draped and DuraPrep Patient monitoring: continuous pulse ox and blood pressure Approach: midline Location: L3-L4 Injection technique: LOR air  Needle:  Needle type: Tuohy  Needle gauge: 17 G Needle length: 9 cm and 9 Needle insertion depth: 5 cm cm Catheter type: closed end flexible Catheter size: 19 Gauge Catheter at skin depth: 10 cm Test dose: negative and Other  Assessment Sensory level: T9 Events: blood not aspirated, injection not painful, no injection resistance, negative IV test and no paresthesia  Additional Notes Reason for block:procedure for pain

## 2017-06-15 NOTE — Anesthesia Pain Management Evaluation Note (Signed)
  CRNA Pain Management Visit Note  Patient: Lori Best, 36 y.o., female  "Hello I am a member of the anesthesia team at [facility]. We have an anesthesia team available at all times to provide care throughout the hospital, including epidural management and anesthesia for C-section. I don't know your plan for the delivery whether it a natural birth, water birth, IV sedation, nitrous supplementation, doula or epidural, but we want to meet your pain goals."   1.Was your pain managed to your expectations on prior hospitalizations?   Yes   2.What is your expectation for pain management during this hospitalization?     Epidural  3.How can we help you reach that goal? unsure  Record the patient's initial score and the patient's pain goal.   Pain: 0  Pain Goal: 5 [facility] wants you to be able to say your pain was always managed very well.  Casimer Lanius 06/15/2017

## 2017-06-15 NOTE — Progress Notes (Signed)
S: No c/o. Not feeling contractions yet.  O:  AF BP 120-130s/70-90s SVE 4cm - AROM for clear fluid, vertex   A/P: 36yo G2P1001, h/o VAVD x 2, here for IOL @ 40.0 wga.   # IOL w/favorable cervix: - pitocin initiated - s/p AROM for clear fluid  # Mild elevated BP:  - PIH labs sent, f/u results. No s/s severe PIH  # GBS pos:  - PCN allergy. S/p Clindamycin (GBS sensitive) x1. Cont per protocol   Arty Baumgartner MD

## 2017-06-15 NOTE — Anesthesia Preprocedure Evaluation (Signed)
Anesthesia Evaluation  Patient identified by MRN, date of birth, ID band Patient awake    Reviewed: Allergy & Precautions, H&P , NPO status , Patient's Chart, lab work & pertinent test results  Airway Mallampati: I  TM Distance: >3 FB Neck ROM: full    Dental no notable dental hx. (+) Teeth Intact   Pulmonary neg pulmonary ROS,    Pulmonary exam normal breath sounds clear to auscultation       Cardiovascular negative cardio ROS Normal cardiovascular exam Rhythm:regular Rate:Normal     Neuro/Psych negative psych ROS   GI/Hepatic negative GI ROS, Neg liver ROS,   Endo/Other  negative endocrine ROS  Renal/GU negative Renal ROS     Musculoskeletal negative musculoskeletal ROS (+)   Abdominal Normal abdominal exam  (+)   Peds  Hematology negative hematology ROS (+)   Anesthesia Other Findings   Reproductive/Obstetrics (+) Pregnancy                             Anesthesia Physical Anesthesia Plan  ASA: II  Anesthesia Plan: Epidural   Post-op Pain Management:    Induction:   PONV Risk Score and Plan:   Airway Management Planned:   Additional Equipment:   Intra-op Plan:   Post-operative Plan:   Informed Consent: I have reviewed the patients History and Physical, chart, labs and discussed the procedure including the risks, benefits and alternatives for the proposed anesthesia with the patient or authorized representative who has indicated his/her understanding and acceptance.     Plan Discussed with:   Anesthesia Plan Comments:         Anesthesia Quick Evaluation

## 2017-06-16 LAB — CBC
HCT: 30.7 % — ABNORMAL LOW (ref 36.0–46.0)
Hemoglobin: 10.5 g/dL — ABNORMAL LOW (ref 12.0–15.0)
MCH: 33.2 pg (ref 26.0–34.0)
MCHC: 34.2 g/dL (ref 30.0–36.0)
MCV: 97.2 fL (ref 78.0–100.0)
PLATELETS: 191 10*3/uL (ref 150–400)
RBC: 3.16 MIL/uL — AB (ref 3.87–5.11)
RDW: 13.5 % (ref 11.5–15.5)
WBC: 11.4 10*3/uL — ABNORMAL HIGH (ref 4.0–10.5)

## 2017-06-16 NOTE — Lactation Note (Signed)
This note was copied from a baby's chart. Lactation Consultation Note; Mother is experienced with breastfeeding her first child for 18 months. Mother reports that infant is feeding well. She denies having any discomfort with feeding.mother Observed mother breastfeeding infant in cradle hold on the left breast.  Mother advised to hand express before and after each feeding. Mother to allow for cue base feeding and breastfeed 8-12 times in 24 hours. Mother advised in treatment and prevention of engorgement. Mother also informed of S/S of Mastitis. Mother was given Lactation Brochure and reviewed all available [place] services. Mother given number to [facility] office for future concerns. Mother is aware that an OP clinic is available.   Patient Name: Lori Best [id number] Date: 06/16/2017 Reason for consult: Initial assessment   Maternal Data Has patient been taught Hand Expression?: Yes Does the patient have breastfeeding experience prior to this delivery?: Yes  Feeding Feeding Type: Breast Fed Length of feed: 0 min  LATCH Score                   Interventions    Lactation Tools Discussed/Used     Consult Status Consult Status: Complete    Darla Lesches 06/16/2017, 12:11 PM

## 2017-06-16 NOTE — Discharge Summary (Signed)
Obstetric Discharge Summary Reason for Admission: induction of labor Prenatal Procedures: none Intrapartum Procedures: spontaneous vaginal delivery Postpartum Procedures: none Complications-Operative and Postpartum: 1st degree perineal laceration Hemoglobin  Date Value Ref Range Status  06/16/2017 10.5 (L) 12.0 - 15.0 g/dL Final   HCT  Date Value Ref Range Status  06/16/2017 30.7 (L) 36.0 - 46.0 % Final    Physical Exam:  General: alert, cooperative and appears stated age 36: appropriate Uterine Fundus: firm Incision: healing well, no significant drainage, no dehiscence, no significant erythema DVT Evaluation: No evidence of DVT seen on physical exam. Negative Homan's sign. No cords or calf tenderness. No significant calf/ankle edema.  Discharge Diagnoses: Term Pregnancy-delivered  Discharge Information: Date: 06/16/2017 Activity: pelvic rest Diet: routine Medications: Ibuprofen and Colace Condition: stable Instructions: refer to practice specific booklet Discharge to: home   Newborn Data: Live born female  Birth Weight: 7 lb 6.9 oz (3370 g) APGAR: 9, 9  Home with mother.  Lori Best 06/16/2017, 8:19 AM

## 2017-06-18 ENCOUNTER — Inpatient Hospital Stay (HOSPITAL_COMMUNITY): Admission: AD | Admit: 2017-06-18 | Payer: 59 | Source: Ambulatory Visit | Admitting: Obstetrics and Gynecology

## 2018-06-25 ENCOUNTER — Other Ambulatory Visit: Payer: Self-pay | Admitting: Obstetrics and Gynecology

## 2018-06-25 DIAGNOSIS — Z1231 Encounter for screening mammogram for malignant neoplasm of breast: Secondary | ICD-10-CM

## 2018-10-07 ENCOUNTER — Ambulatory Visit
Admission: RE | Admit: 2018-10-07 | Discharge: 2018-10-07 | Disposition: A | Discharge status: Home / Self Care | Payer: BLUE CROSS/BLUE SHIELD | Source: Ambulatory Visit | Attending: Obstetrics and Gynecology | Admitting: Obstetrics and Gynecology

## 2018-10-07 ENCOUNTER — Encounter (INDEPENDENT_AMBULATORY_CARE_PROVIDER_SITE_OTHER): Payer: Self-pay

## 2018-10-07 DIAGNOSIS — Z1231 Encounter for screening mammogram for malignant neoplasm of breast: Secondary | ICD-10-CM

## 2019-10-06 ENCOUNTER — Other Ambulatory Visit: Payer: Self-pay | Admitting: Obstetrics and Gynecology

## 2019-10-06 DIAGNOSIS — Z1231 Encounter for screening mammogram for malignant neoplasm of breast: Secondary | ICD-10-CM

## 2019-10-08 ENCOUNTER — Ambulatory Visit: Payer: 59 | Attending: Internal Medicine

## 2019-11-18 ENCOUNTER — Other Ambulatory Visit: Payer: Self-pay

## 2019-11-18 ENCOUNTER — Ambulatory Visit
Admission: RE | Admit: 2019-11-18 | Discharge: 2019-11-18 | Disposition: A | Discharge status: Home / Self Care | Payer: 59 | Source: Ambulatory Visit | Attending: Obstetrics and Gynecology | Admitting: Obstetrics and Gynecology

## 2019-11-18 DIAGNOSIS — Z1231 Encounter for screening mammogram for malignant neoplasm of breast: Secondary | ICD-10-CM

## 2020-02-22 ENCOUNTER — Encounter (HOSPITAL_BASED_OUTPATIENT_CLINIC_OR_DEPARTMENT_OTHER): Payer: Self-pay | Admitting: *Deleted

## 2020-02-22 ENCOUNTER — Emergency Department (HOSPITAL_BASED_OUTPATIENT_CLINIC_OR_DEPARTMENT_OTHER)
Admission: EM | Admit: 2020-02-22 | Discharge: 2020-02-23 | Disposition: A | Discharge status: Home / Self Care | Payer: 59 | Attending: Emergency Medicine | Admitting: Emergency Medicine

## 2020-02-22 ENCOUNTER — Other Ambulatory Visit: Payer: Self-pay

## 2020-02-22 DIAGNOSIS — Z79899 Other long term (current) drug therapy: Secondary | ICD-10-CM | POA: Insufficient documentation

## 2020-02-22 DIAGNOSIS — R519 Headache, unspecified: Secondary | ICD-10-CM | POA: Diagnosis not present

## 2020-02-22 DIAGNOSIS — R42 Dizziness and giddiness: Secondary | ICD-10-CM | POA: Insufficient documentation

## 2020-02-22 MED ORDER — SODIUM CHLORIDE 0.9 % IV BOLUS
1000.0000 mL | Freq: Once | INTRAVENOUS | Status: AC
  Administered 2020-02-23: 1000 mL via INTRAVENOUS

## 2020-02-22 MED ORDER — PROCHLORPERAZINE EDISYLATE 10 MG/2ML IJ SOLN
10.0000 mg | Freq: Once | INTRAMUSCULAR | Status: AC
  Administered 2020-02-22: 10 mg via INTRAVENOUS
  Filled 2020-02-22: qty 2

## 2020-02-22 MED ORDER — DIPHENHYDRAMINE HCL 50 MG/ML IJ SOLN
25.0000 mg | Freq: Once | INTRAMUSCULAR | Status: AC
  Administered 2020-02-22: 25 mg via INTRAVENOUS
  Filled 2020-02-22: qty 1

## 2020-02-22 MED ORDER — MECLIZINE HCL 25 MG PO TABS
25.0000 mg | ORAL_TABLET | Freq: Once | ORAL | Status: AC
  Administered 2020-02-22: 25 mg via ORAL
  Filled 2020-02-22: qty 1

## 2020-02-22 NOTE — ED Notes (Signed)
Pt on monitor, EKG complete 

## 2020-02-22 NOTE — ED Provider Notes (Signed)
[facility] EMERGENCY DEPARTMENT Provider Note   CSN: [id number] Arrival date & time: 02/22/20  2226     History Chief Complaint  Patient presents with  . Dizziness    Lori Best is a 39 y.o. female.  HPI      This is a 39 year old female who presents with dizziness and headache.  Patient states she has had 1 week of room spinning dizziness.  She states that it is worse with position changes.  She has had some nausea without vomiting.  Patient states that she has recurrent migraine headaches and feels that this may be related.  She is currently having a headache and describes it as bitemporal.  Pain is 6 out of 10.  She took ibuprofen with minimal relief.  She states that the pain is somewhat different than her prior headaches.  She denies any strokelike symptoms.  Denies any vision changes.  Patient does report that she has felt like her blood pressure has been elevated recently.  She has documented blood pressures at home of [id number] systolic.  No history of high blood pressure.  When asked what symptoms she has had, she reports that "my hands and feet have felt heavy as has my head."  She denies any recent infectious symptoms, fevers, cough, shortness of breath, abdominal pain, chest pain.  Past Medical History:  Diagnosis Date  . Headache    migraines  . Hx of varicella   . Vaginal Pap smear, abnormal     Patient Active Problem List   Diagnosis Date Noted  . Pregnant 06/15/2017  . Vaginal delivery 08/22/2014  . Term pregnancy 08/21/2014    Past Surgical History:  Procedure Laterality Date  . PILONIDAL CYST EXCISION  2004     OB History    Gravida  2   Para  2   Term  2   Preterm      AB      Living  2     SAB      TAB      Ectopic      Multiple  0   Live Births  2           Family History  Problem Relation Age of Onset  . Kidney Stones Mother   . Cancer Mother        breast  . Hypertension Father   .  Hypertension Paternal Grandmother   . Cancer Paternal Grandmother        breast colon  . Birth defects Son        laryngomalacia    Social History   Tobacco Use  . Smoking status: Never Smoker  . Smokeless tobacco: Never Used  Substance Use Topics  . Alcohol use: No  . Drug use: No    Home Medications Prior to Admission medications   Medication Sig Start Date End Date Taking? Authorizing Provider  norgestimate-ethinyl estradiol (ORTHO-CYCLEN) 0.25-35 MG-MCG tablet Take 1 tablet by mouth daily.   Yes [provider]  acetaminophen (TYLENOL) 325 MG tablet Take 650 mg by mouth every 6 (six) hours as needed for mild pain or moderate pain.    [provider]  meclizine (ANTIVERT) 25 MG tablet Take 1 tablet (25 mg total) by mouth 3 (three) times daily as needed for dizziness. 02/23/20   Raylyn Carton, Barbette Hair, MD  Prenatal Vit-Fe Fumarate-FA (PRENATAL MULTIVITAMIN) TABS tablet Take 1 tablet by mouth daily at 12 noon.    [provider]  Allergies    Penicillins  Review of Systems   Review of Systems  Constitutional: Negative for fever.  Eyes: Negative for visual disturbance.  Respiratory: Negative for cough and shortness of breath.   Cardiovascular: Negative for chest pain.  Gastrointestinal: Positive for nausea. Negative for abdominal pain and vomiting.  Genitourinary: Negative for dysuria.  Musculoskeletal: Negative for back pain.  Neurological: Positive for dizziness and headaches. Negative for weakness, light-headedness and numbness.  All other systems reviewed and are negative.   Physical Exam Updated Vital Signs BP (!) 138/97   Pulse 77   Temp 98.3 F (36.8 C) (Oral)   Resp 15   Ht 1.575 m (5\' 2" )   Wt 61.7 kg   LMP 01/24/2020   SpO2 98%   Breastfeeding No   BMI 24.87 kg/m   Physical Exam Vitals and nursing note reviewed.  Constitutional:      Appearance: She is well-developed. She is not ill-appearing.  HENT:     Head:  Normocephalic and atraumatic.     Nose: Nose normal.     Mouth/Throat:     Mouth: Mucous membranes are moist.  Eyes:     Pupils: Pupils are equal, round, and reactive to light.     Comments: Horizontal nystagmus noted with leftward gaze, extraocular movements intact  Cardiovascular:     Rate and Rhythm: Normal rate and regular rhythm.     Heart sounds: Normal heart sounds.  Pulmonary:     Effort: Pulmonary effort is normal. No respiratory distress.     Breath sounds: No wheezing.  Abdominal:     General: Bowel sounds are normal.     Palpations: Abdomen is soft.     Tenderness: There is no abdominal tenderness. There is no guarding or rebound.  Musculoskeletal:     Cervical back: Neck supple.     Left lower leg: No edema.  Skin:    General: Skin is warm and dry.  Neurological:     Mental Status: She is alert and oriented to person, place, and time.     Comments: Cranial nerves II through XII intact, 5 out of 5 strength in all 4 extremities, no dysmetria to finger-nose-finger  Psychiatric:        Mood and Affect: Mood normal.     ED Results / Procedures / Treatments   Labs (all labs ordered are listed, but only abnormal results are displayed) Labs Reviewed  BASIC METABOLIC PANEL - Abnormal; Notable for the following components:      Result Value   Glucose, Bld 110 (*)    Calcium 8.8 (*)    All other components within normal limits  CBC WITH DIFFERENTIAL/PLATELET  PREGNANCY, URINE    EKG EKG Interpretation  Date/Time:  Sunday Feb 22 2020 23:09:58 EDT Ventricular Rate:  91 PR Interval:  134 QRS Duration: 92 QT Interval:  374 QTC Calculation: 460 R Axis:   72 Text Interpretation: Normal sinus rhythm Normal ECG Confirmed by Thayer Jew [phone]) on 02/23/2020 12:03:14 AM   Radiology No results found.  Procedures Procedures (including critical care time)  Medications Ordered in ED Medications  sodium chloride 0.9 % bolus 1,000 mL (1,000 mLs Intravenous New  Bag/Given 02/23/20 0015)  meclizine (ANTIVERT) tablet 25 mg (25 mg Oral Given 02/22/20 2357)  prochlorperazine (COMPAZINE) injection 10 mg (10 mg Intravenous Given 02/22/20 2359)  diphenhydrAMINE (BENADRYL) injection 25 mg (25 mg Intravenous Given 02/22/20 2359)    ED Course  I have reviewed the triage vital signs  and the nursing notes.  Pertinent labs & imaging results that were available during my care of the patient were reviewed by me and considered in my medical decision making (see chart for details).    MDM Rules/Calculators/A&P                      Patient presents with dizziness.  She is overall nontoxic appearing.  Initial vital signs notable for blood pressure of 184/107.  No prior history.  While in the room on my exam her blood pressure is 150 over 90s.  She is neurologically intact.  Given history and description of dizziness, more suspicious for peripheral vertigo than central vertigo especially given benign neurologic exam.  I discussed with the patient and her family that I was unsure whether her blood pressure may be a cause or an effect of her symptoms.  Given that her blood pressures have settled down and she is still symptomatic, have low suspicion for hypertensive urgency or emergency.  Patient was given migraine cocktail and meclizine.  Lab work obtained shows no evidence of significant metabolic derangement.  No anemia.  Patient is not orthostatic.  EKG shows no signs of ischemia or arrhythmia.  On recheck, patient improved and vertiginous symptoms are no longer present.  She states that she is at her baseline.  Her blood pressure is 138/87 on my final assessment.  Low suspicion at this time again for central cause of vertigo.  Suspect peripheral.  Will send home with meclizine.  We will have her monitor her blood pressure and follow-up with her primary physician.  No indication for imaging at this time.  After history, exam, and medical workup I feel the patient has been  appropriately medically screened and is safe for discharge home. Pertinent diagnoses were discussed with the patient. Patient was given return precautions.  Final Clinical Impression(s) / ED Diagnoses Final diagnoses:  Vertigo    Rx / DC Orders ED Discharge Orders         Ordered    meclizine (ANTIVERT) 25 MG tablet  3 times daily PRN     02/23/20 0112           Merryl Hacker, MD 02/23/20 [phone]

## 2020-02-22 NOTE — ED Triage Notes (Signed)
Pt reports dizziness x 1 week. She has a hx of mha and thought it was possibly related to that. States sx are worse when she turns her head. Today she reports her BP has been higher than usual

## 2020-02-23 LAB — BASIC METABOLIC PANEL
Anion gap: 12 (ref 5–15)
BUN: 14 mg/dL (ref 6–20)
CO2: 22 mmol/L (ref 22–32)
Calcium: 8.8 mg/dL — ABNORMAL LOW (ref 8.9–10.3)
Chloride: 106 mmol/L (ref 98–111)
Creatinine, Ser: 0.75 mg/dL (ref 0.44–1.00)
GFR calc Af Amer: >60 mL/min (ref >60)
GFR calc non Af Amer: >60 mL/min (ref >60)
Glucose, Bld: 110 mg/dL — ABNORMAL HIGH (ref 70–99)
Potassium: 3.5 mmol/L (ref 3.5–5.1)
Sodium: 140 mmol/L (ref 135–145)

## 2020-02-23 LAB — CBC WITH DIFFERENTIAL/PLATELET
Abs Immature Granulocytes: 0 10*3/uL (ref 0.00–0.07)
Basophils Absolute: 0 10*3/uL (ref 0.0–0.1)
Basophils Relative: 0 %
Eosinophils Absolute: 0.2 10*3/uL (ref 0.0–0.5)
Eosinophils Relative: 4 %
HCT: 36.9 % (ref 36.0–46.0)
Hemoglobin: 12.7 g/dL (ref 12.0–15.0)
Immature Granulocytes: 0 %
Lymphocytes Relative: 33 %
Lymphs Abs: 1.6 10*3/uL (ref 0.7–4.0)
MCH: 32.3 pg (ref 26.0–34.0)
MCHC: 34.4 g/dL (ref 30.0–36.0)
MCV: 93.9 fL (ref 80.0–100.0)
Monocytes Absolute: 0.5 10*3/uL (ref 0.1–1.0)
Monocytes Relative: 10 %
Neutro Abs: 2.6 10*3/uL (ref 1.7–7.7)
Neutrophils Relative %: 53 %
Platelets: 295 10*3/uL (ref 150–400)
RBC: 3.93 MIL/uL (ref 3.87–5.11)
RDW: 11.5 % (ref 11.5–15.5)
WBC: 4.9 10*3/uL (ref 4.0–10.5)
nRBC: 0 % (ref 0.0–0.2)

## 2020-02-23 MED ORDER — MECLIZINE HCL 25 MG PO TABS: 25.0000 mg | ORAL_TABLET | Freq: Three times a day (TID) | ORAL | 0 refills | Status: AC | PRN

## 2020-02-23 NOTE — Discharge Instructions (Addendum)
You were seen today for dizziness.  Your symptoms are most consistent with peripheral vertigo.  Take meclizine as needed.  If you have recurrent symptoms, you may need to see an ENT.  If you develop any strokelike symptoms or worsening headaches, you should follow-up for recheck immediately.

## 2020-02-26 ENCOUNTER — Other Ambulatory Visit: Payer: Self-pay | Admitting: Family Medicine

## 2020-02-26 ENCOUNTER — Other Ambulatory Visit: Payer: Self-pay

## 2020-02-26 ENCOUNTER — Ambulatory Visit
Admission: RE | Admit: 2020-02-26 | Discharge: 2020-02-26 | Disposition: A | Discharge status: Home / Self Care | Payer: 59 | Source: Ambulatory Visit | Attending: Family Medicine | Admitting: Family Medicine

## 2020-02-26 DIAGNOSIS — G43111 Migraine with aura, intractable, with status migrainosus: Secondary | ICD-10-CM

## 2020-02-26 DIAGNOSIS — R202 Paresthesia of skin: Secondary | ICD-10-CM

## 2020-10-12 ENCOUNTER — Other Ambulatory Visit: Payer: Self-pay | Admitting: Obstetrics and Gynecology

## 2020-10-12 DIAGNOSIS — Z1231 Encounter for screening mammogram for malignant neoplasm of breast: Secondary | ICD-10-CM

## 2020-11-24 ENCOUNTER — Other Ambulatory Visit: Payer: Self-pay

## 2020-11-24 ENCOUNTER — Ambulatory Visit
Admission: RE | Admit: 2020-11-24 | Discharge: 2020-11-24 | Disposition: A | Discharge status: Home / Self Care | Payer: BC Managed Care – PPO | Source: Ambulatory Visit | Attending: Obstetrics and Gynecology | Admitting: Obstetrics and Gynecology

## 2020-11-24 DIAGNOSIS — Z1231 Encounter for screening mammogram for malignant neoplasm of breast: Secondary | ICD-10-CM | POA: Diagnosis not present

## 2020-11-29 ENCOUNTER — Other Ambulatory Visit: Payer: Self-pay | Admitting: Obstetrics and Gynecology

## 2020-11-29 DIAGNOSIS — R928 Other abnormal and inconclusive findings on diagnostic imaging of breast: Secondary | ICD-10-CM

## 2020-12-04 ENCOUNTER — Other Ambulatory Visit: Payer: Self-pay | Admitting: Obstetrics and Gynecology

## 2020-12-04 ENCOUNTER — Ambulatory Visit
Admission: RE | Admit: 2020-12-04 | Discharge: 2020-12-04 | Disposition: A | Discharge status: Home / Self Care | Payer: BC Managed Care – PPO | Source: Ambulatory Visit | Attending: Obstetrics and Gynecology | Admitting: Obstetrics and Gynecology

## 2020-12-04 ENCOUNTER — Other Ambulatory Visit: Payer: Self-pay

## 2020-12-04 DIAGNOSIS — R928 Other abnormal and inconclusive findings on diagnostic imaging of breast: Secondary | ICD-10-CM | POA: Diagnosis not present

## 2020-12-04 DIAGNOSIS — R921 Mammographic calcification found on diagnostic imaging of breast: Secondary | ICD-10-CM | POA: Diagnosis not present

## 2020-12-04 DIAGNOSIS — R922 Inconclusive mammogram: Secondary | ICD-10-CM | POA: Diagnosis not present

## 2020-12-04 DIAGNOSIS — Z803 Family history of malignant neoplasm of breast: Secondary | ICD-10-CM | POA: Diagnosis not present

## 2020-12-10 ENCOUNTER — Ambulatory Visit
Admission: RE | Admit: 2020-12-10 | Discharge: 2020-12-10 | Disposition: A | Discharge status: Home / Self Care | Payer: BC Managed Care – PPO | Source: Ambulatory Visit | Attending: Obstetrics and Gynecology | Admitting: Obstetrics and Gynecology

## 2020-12-10 ENCOUNTER — Other Ambulatory Visit: Payer: Self-pay

## 2020-12-10 DIAGNOSIS — R921 Mammographic calcification found on diagnostic imaging of breast: Secondary | ICD-10-CM | POA: Diagnosis not present

## 2020-12-10 DIAGNOSIS — R928 Other abnormal and inconclusive findings on diagnostic imaging of breast: Secondary | ICD-10-CM

## 2020-12-10 DIAGNOSIS — D0511 Intraductal carcinoma in situ of right breast: Secondary | ICD-10-CM | POA: Diagnosis not present

## 2020-12-13 ENCOUNTER — Other Ambulatory Visit: Payer: Self-pay | Admitting: Family Medicine

## 2020-12-13 ENCOUNTER — Other Ambulatory Visit: Payer: Self-pay | Admitting: Obstetrics and Gynecology

## 2020-12-13 DIAGNOSIS — C50911 Malignant neoplasm of unspecified site of right female breast: Secondary | ICD-10-CM

## 2020-12-13 DIAGNOSIS — Z170 Estrogen receptor positive status [ER+]: Secondary | ICD-10-CM

## 2020-12-17 ENCOUNTER — Other Ambulatory Visit: Payer: Self-pay

## 2020-12-17 ENCOUNTER — Ambulatory Visit
Admission: RE | Admit: 2020-12-17 | Discharge: 2020-12-17 | Disposition: A | Discharge status: Home / Self Care | Payer: BC Managed Care – PPO | Source: Ambulatory Visit | Attending: Family Medicine | Admitting: Family Medicine

## 2020-12-17 DIAGNOSIS — C50911 Malignant neoplasm of unspecified site of right female breast: Secondary | ICD-10-CM

## 2020-12-17 DIAGNOSIS — D0511 Intraductal carcinoma in situ of right breast: Secondary | ICD-10-CM | POA: Diagnosis not present

## 2020-12-17 MED ORDER — GADOBUTROL 1 MMOL/ML IV SOLN
7.0000 mL | Freq: Once | INTRAVENOUS | Status: AC | PRN
  Administered 2020-12-17: 7 mL via INTRAVENOUS

## 2020-12-21 DIAGNOSIS — D2272 Melanocytic nevi of left lower limb, including hip: Secondary | ICD-10-CM | POA: Diagnosis not present

## 2020-12-21 DIAGNOSIS — L814 Other melanin hyperpigmentation: Secondary | ICD-10-CM | POA: Diagnosis not present

## 2020-12-21 DIAGNOSIS — D2271 Melanocytic nevi of right lower limb, including hip: Secondary | ICD-10-CM | POA: Diagnosis not present

## 2020-12-21 DIAGNOSIS — D2261 Melanocytic nevi of right upper limb, including shoulder: Secondary | ICD-10-CM | POA: Diagnosis not present

## 2020-12-22 ENCOUNTER — Encounter: Payer: Self-pay | Admitting: *Deleted

## 2020-12-22 ENCOUNTER — Encounter: Payer: Self-pay | Admitting: Adult Health

## 2020-12-22 DIAGNOSIS — C50411 Malignant neoplasm of upper-outer quadrant of right female breast: Secondary | ICD-10-CM | POA: Insufficient documentation

## 2020-12-23 ENCOUNTER — Telehealth: Payer: Self-pay | Admitting: Hematology and Oncology

## 2020-12-23 NOTE — Telephone Encounter (Signed)
I cld Lori Best and confirmed her new patient appt w/Dr. Lindi Adie on 3/25 at 11:45am and genetics on 3/31 at 9am.

## 2020-12-23 NOTE — Progress Notes (Signed)
[facility] NOTE  Patient Care Team: Janith Lima, MD as PCP - General (Family Medicine) Mauro Kaufmann, RN as Oncology Nurse Navigator Rockwell Germany, RN as Oncology Nurse Navigator  CHIEF COMPLAINTS/PURPOSE OF CONSULTATION:  Newly diagnosed right breast DCIS  HISTORY OF PRESENTING ILLNESS:  Lori Best 40 y.o. female is here because of recent diagnosis of DCIS of the right breast. Screening mammogram on 11/24/20 showed left breast calcifications. Diagnostic mammogram and [place] on 12/04/20 showed a 0.3cm group of calcifications in the upper outer right breast. Biopsy on 12/10/20 showed high grade DCIS, ER+ 80%, PR- 0%. Breast MRI on 12/17/20 showed the known DCIS in the right breast and no other evidence of carcinoma. She presents to the clinic today for initial evaluation and discussion of treatment options.   I reviewed her records extensively and collaborated the history with the patient.  SUMMARY OF ONCOLOGIC HISTORY: Oncology History  Malignant neoplasm of upper-outer quadrant of right breast in female, estrogen receptor positive ([place])  12/10/2020 Cancer Staging   Staging form: Breast, AJCC 8th Edition - Clinical stage from 12/10/2020: Stage 0 (cTis (DCIS), cN0, cM0, ER+, PR-) - Signed by Gardenia Phlegm, NP on 12/22/2020 Stage prefix: Initial diagnosis   12/22/2020 Initial Diagnosis   Screening mammogram showed left breast calcifications. Diagnostic mammogram and US showed a 0.3cm group of calcifications in the upper outer right breast. Biopsy showed high grade DCIS, ER+ 80%, PR- 0%. Breast MRI on 12/17/20 showed the known DCIS in the right breast and no other evidence of carcinoma.      MEDICAL HISTORY:  Past Medical History:  Diagnosis Date  . Headache    migraines  . Hx of varicella   . Vaginal Pap smear, abnormal     SURGICAL HISTORY: Past Surgical History:  Procedure Laterality Date  . PILONIDAL CYST EXCISION   2004    SOCIAL HISTORY: Social History   Socioeconomic History  . Marital status: Married    Spouse name: Not on file  . Number of children: Not on file  . Years of education: Not on file  . Highest education level: Not on file  Occupational History  . Not on file  Tobacco Use  . Smoking status: Never Smoker  . Smokeless tobacco: Never Used  Substance and Sexual Activity  . Alcohol use: No  . Drug use: No  . Sexual activity: Yes  Other Topics Concern  . Not on file  Social History Narrative  . Not on file   Social Determinants of Health   Financial Resource Strain: Not on file  Food Insecurity: Not on file  Transportation Needs: Not on file  Physical Activity: Not on file  Stress: Not on file  Social Connections: Not on file  Intimate Partner Violence: Not on file    FAMILY HISTORY: Family History  Problem Relation Age of Onset  . Kidney Stones Mother   . Cancer Mother        breast  . Hypertension Father   . Hypertension Paternal Grandmother   . Cancer Paternal Grandmother        breast colon  . Birth defects Son        laryngomalacia    ALLERGIES:  is allergic to penicillins.  MEDICATIONS:  Current Outpatient Medications  Medication Sig Dispense Refill  . acetaminophen (TYLENOL) 325 MG tablet Take 650 mg by mouth every 6 (six) hours as needed for mild pain or moderate pain.    Marland Kitchen  meclizine (ANTIVERT) 25 MG tablet Take 1 tablet (25 mg total) by mouth 3 (three) times daily as needed for dizziness. 30 tablet 0  . norgestimate-ethinyl estradiol (ORTHO-CYCLEN) 0.25-35 MG-MCG tablet Take 1 tablet by mouth daily.    . Prenatal Vit-Fe Fumarate-FA (PRENATAL MULTIVITAMIN) TABS tablet Take 1 tablet by mouth daily at 12 noon.     No current facility-administered medications for this visit.    REVIEW OF SYSTEMS:     All other systems were reviewed with the patient and are negative.  PHYSICAL EXAMINATION: ECOG PERFORMANCE STATUS: 0 - Asymptomatic  Vitals:    12/24/20 1150  BP: (!) 146/91  Pulse: (!) 54  Resp: 18  Temp: 97.7 F (36.5 C)  SpO2: 99%   Filed Weights   12/24/20 1150  Weight: 131 lb 3.2 oz (59.5 kg)       LABORATORY DATA:  I have reviewed the data as listed Lab Results  Component Value Date   WBC 4.9 02/23/2020   HGB 12.7 02/23/2020   HCT 36.9 02/23/2020   MCV 93.9 02/23/2020   PLT 295 02/23/2020   Lab Results  Component Value Date   NA 140 02/23/2020   K 3.5 02/23/2020   CL 106 02/23/2020   CO2 22 02/23/2020    RADIOGRAPHIC STUDIES: I have personally reviewed the radiological reports and agreed with the findings in the report.  ASSESSMENT AND PLAN:  Malignant neoplasm of upper-outer quadrant of right breast in female, estrogen receptor positive ([place]) Screening mammogram showed left breast calcifications. Diagnostic mammogram and US showed a 0.3cm group of calcifications in the upper outer right breast. Biopsy showed high grade DCIS, ER+ 80%, PR- 0%. Breast MRI on 12/17/20 showed the known DCIS in the right breast and no other evidence of carcinoma.   Pathology review: I discussed with the patient the difference between DCIS and invasive breast cancer. It is considered a precancerous lesion. DCIS is classified as a 0. It is generally detected through mammograms as calcifications. We discussed the significance of grades and its impact on prognosis. We also discussed the importance of ER and PR receptors and their implications to adjuvant treatment options. Prognosis of DCIS dependence on grade, comedo necrosis. It is anticipated that if not treated, 20-30% of DCIS can develop into invasive breast cancer.  Recommendation: Bilateral mastectomies If the final pathology also shows DCIS, she does not need further adjuvant therapy. Genetic testing: 2019: No genetic mutations (her mother had breast cancer age 64 and her grandmother) She is meeting with our genetic counselors next week and with Dr. Donne Hazel as well to  discuss surgical plans.  MRI of the breast showed abnormality of the liver: Apparently she had this abnormality in 2017.  I would like to get a liver directed MRI for further evaluation. We will call her with the result of this test.  Return to clinic after surgery to discuss final pathology report.  If there is no evidence of invasive cancer then she can be seen by [place] on an as-needed basis.   All questions were answered. The patient knows to call the clinic with any problems, questions or concerns.   Rulon Eisenmenger, MD, MPH 12/24/2020    I, Molly Dorshimer, am acting as scribe for Nicholas Lose, MD.  I have reviewed the above documentation for accuracy and completeness, and I agree with the above.

## 2020-12-23 NOTE — Assessment & Plan Note (Addendum)
Screening mammogram showed left breast calcifications. Diagnostic mammogram and US showed a 0.3cm group of calcifications in the upper outer right breast. Biopsy showed high grade DCIS, ER+ 80%, PR- 0%. Breast MRI on 12/17/20 showed the known DCIS in the right breast and no other evidence of carcinoma.   Pathology review: I discussed with the patient the difference between DCIS and invasive breast cancer. It is considered a precancerous lesion. DCIS is classified as a 0. It is generally detected through mammograms as calcifications. We discussed the significance of grades and its impact on prognosis. We also discussed the importance of ER and PR receptors and their implications to adjuvant treatment options. Prognosis of DCIS dependence on grade, comedo necrosis. It is anticipated that if not treated, 20-30% of DCIS can develop into invasive breast cancer.  Recommendation: Bilateral mastectomies If the final pathology also shows DCIS, she does not need further adjuvant therapy. Genetic testing: 2019: No genetic mutations (her mother had breast cancer age 86 and her grandmother) She is meeting with our genetic counselors next week and with Dr. Donne Hazel as well to discuss surgical plans.  Return to clinic after surgery to discuss final pathology report.  If there is no evidence of invasive cancer then she can be seen by [place] on an as-needed basis.

## 2020-12-24 ENCOUNTER — Inpatient Hospital Stay: Payer: BC Managed Care – PPO | Attending: Hematology and Oncology | Admitting: Hematology and Oncology

## 2020-12-24 ENCOUNTER — Other Ambulatory Visit: Payer: Self-pay

## 2020-12-24 DIAGNOSIS — C50411 Malignant neoplasm of upper-outer quadrant of right female breast: Secondary | ICD-10-CM

## 2020-12-24 DIAGNOSIS — Z170 Estrogen receptor positive status [ER+]: Secondary | ICD-10-CM | POA: Diagnosis not present

## 2020-12-24 DIAGNOSIS — D0511 Intraductal carcinoma in situ of right breast: Secondary | ICD-10-CM | POA: Insufficient documentation

## 2020-12-24 DIAGNOSIS — K769 Liver disease, unspecified: Secondary | ICD-10-CM

## 2020-12-27 ENCOUNTER — Encounter: Payer: Self-pay | Admitting: *Deleted

## 2020-12-30 ENCOUNTER — Encounter: Payer: Self-pay | Admitting: Licensed Clinical Social Worker

## 2020-12-30 ENCOUNTER — Inpatient Hospital Stay: Payer: BC Managed Care – PPO

## 2020-12-30 ENCOUNTER — Inpatient Hospital Stay (HOSPITAL_BASED_OUTPATIENT_CLINIC_OR_DEPARTMENT_OTHER): Payer: BC Managed Care – PPO | Admitting: Genetic Counselor

## 2020-12-30 ENCOUNTER — Other Ambulatory Visit: Payer: Self-pay

## 2020-12-30 DIAGNOSIS — Z803 Family history of malignant neoplasm of breast: Secondary | ICD-10-CM

## 2020-12-30 DIAGNOSIS — Z800 Family history of malignant neoplasm of digestive organs: Secondary | ICD-10-CM | POA: Diagnosis not present

## 2020-12-30 DIAGNOSIS — C50411 Malignant neoplasm of upper-outer quadrant of right female breast: Secondary | ICD-10-CM | POA: Diagnosis not present

## 2020-12-30 DIAGNOSIS — Z8042 Family history of malignant neoplasm of prostate: Secondary | ICD-10-CM

## 2020-12-30 DIAGNOSIS — Z170 Estrogen receptor positive status [ER+]: Secondary | ICD-10-CM

## 2020-12-30 NOTE — Progress Notes (Signed)
[place] Work  Clinical Social Work was referred by new patient protocol for assessment of psychosocial needs.  Clinical Social Worker contacted patient by phone  to offer support and assess for needs.   Patient available only briefly, so CSW gave information on support services and programs available. E-mailed monthly calendar to patient for potential engagement in support group and provided direct contact information for further questions.     [place], [place] Worker [employer]

## 2020-12-31 DIAGNOSIS — D0511 Intraductal carcinoma in situ of right breast: Secondary | ICD-10-CM | POA: Diagnosis not present

## 2021-01-03 ENCOUNTER — Encounter: Payer: Self-pay | Admitting: Genetic Counselor

## 2021-01-03 ENCOUNTER — Telehealth: Payer: Self-pay | Admitting: Hematology and Oncology

## 2021-01-03 ENCOUNTER — Encounter: Payer: Self-pay | Admitting: *Deleted

## 2021-01-03 ENCOUNTER — Other Ambulatory Visit: Payer: Self-pay | Admitting: General Surgery

## 2021-01-03 DIAGNOSIS — Z1379 Encounter for other screening for genetic and chromosomal anomalies: Secondary | ICD-10-CM | POA: Insufficient documentation

## 2021-01-03 DIAGNOSIS — Z803 Family history of malignant neoplasm of breast: Secondary | ICD-10-CM

## 2021-01-03 DIAGNOSIS — Z800 Family history of malignant neoplasm of digestive organs: Secondary | ICD-10-CM

## 2021-01-03 DIAGNOSIS — Z8042 Family history of malignant neoplasm of prostate: Secondary | ICD-10-CM

## 2021-01-03 HISTORY — DX: Family history of malignant neoplasm of digestive organs: Z80.0

## 2021-01-03 HISTORY — DX: Family history of malignant neoplasm of prostate: Z80.42

## 2021-01-03 HISTORY — DX: Family history of malignant neoplasm of breast: Z80.3

## 2021-01-03 NOTE — Telephone Encounter (Signed)
Scheduled appt per 4/4 sch msg. Pt aware.  

## 2021-01-03 NOTE — Progress Notes (Signed)
REFERRING PROVIDER: Nicholas Lose, MD [address] [place],  [place] [address]  PRIMARY PROVIDER:  Janith Lima, MD  PRIMARY REASON FOR VISIT:  1. Malignant neoplasm of upper-outer quadrant of right breast in female, estrogen receptor positive ([place])   2. Family history of breast cancer   3. Family history of prostate cancer   4. Family history of colon cancer     HISTORY OF PRESENT ILLNESS:   Ms. Leiva, a 40 y.o. female, was seen for a [place] cancer genetics consultation at the request of Dr. Lindi Adie due to a personal and family history of cancer.  Ms. Credit presents to clinic today to discuss the possibility of a hereditary predisposition to cancer, to discuss genetic testing, and to further clarify her future cancer risks, as well as potential cancer risks for family members.   In 2022, at the age of 56, Ms. Parslow was diagnosed with ductal carcinoma in situ of the right breast. The preliminary treatment plan includes bilateral mastectomies.   Ms. Rubey previously had negative genetic testing.  No pathogenic variants were detected in [facility] Panel.  The report date is October 17, 2018.  The [facility] gene panel offered by [employer] includes sequencing and deletion/duplication testing of the following 35 genes: APC, ATM, AXIN2, BARD1, BMPR1A, BRCA1, BRCA2, BRIP1, CHD1, CDK4, CDKN2A, CHEK2, EPCAM (large rearrangement only), HOXB13, (sequencing only), GALNT12, MLH1, MSH2, MSH3 (excluding repetitive portions of exon 1), MSH6, MUTYH, NBN, NTHL1, PALB2, PMS2, PTEN, RAD51C, RAD51D, RNF43, RPS20, SMAD4, STK11, and TP53. Sequencing was performed for select regions of POLE and POLD1, and large rearrangement analysis was performed for select regions of GREM1.   CANCER HISTORY:  Oncology History  Malignant neoplasm of upper-outer quadrant of right breast in female, estrogen receptor positive ([place])  12/10/2020 Cancer  Staging   Staging form: Breast, AJCC 8th Edition - Clinical stage from 12/10/2020: Stage 0 (cTis (DCIS), cN0, cM0, ER+, PR-) - Signed by Gardenia Phlegm, NP on 12/22/2020 Stage prefix: Initial diagnosis   12/22/2020 Initial Diagnosis   Screening mammogram showed left breast calcifications. Diagnostic mammogram and US showed a 0.3cm group of calcifications in the upper outer right breast. Biopsy showed high grade DCIS, ER+ 80%, PR- 0%. Breast MRI on 12/17/20 showed the known DCIS in the right breast and no other evidence of carcinoma.      RISK FACTORS:  Menarche was at age 37.  First live birth at age 2.  OCP use for approximately 10 years.  Ovaries intact: yes.  Hysterectomy: no.  Menopausal status: premenopausal.  HRT use: 0 years. Colonoscopy: no; not examined. Mammogram within the last year: yes. Number of breast biopsies: 2 (biopsy of L breast in 2017--PSEUDOANGIOMATOUS STROMAL HYPERPLASIA ([place]). Up to date with pelvic exams: yes.  Other cancer screening: dermatology annually   Past Medical History:  Diagnosis Date  . Family history of breast cancer 01/03/2021  . Family history of colon cancer 01/03/2021  . Family history of prostate cancer 01/03/2021  . Headache    migraines  . Hx of varicella   . Vaginal Pap smear, abnormal     Past Surgical History:  Procedure Laterality Date  . PILONIDAL CYST EXCISION  2004    Social History   Socioeconomic History  . Marital status: Married    Spouse name: Not on file  . Number of children: Not on file  . Years of education: Not on file  . Highest education level: Not on file  Occupational History  .  Not on file  Tobacco Use  . Smoking status: Never Smoker  . Smokeless tobacco: Never Used  Substance and Sexual Activity  . Alcohol use: No  . Drug use: No  . Sexual activity: Yes  Other Topics Concern  . Not on file  Social History Narrative  . Not on file   Social Determinants of Health   Financial Resource  Strain: Not on file  Food Insecurity: Not on file  Transportation Needs: Not on file  Physical Activity: Not on file  Stress: Not on file  Social Connections: Not on file     FAMILY HISTORY:  We obtained a detailed, 4-generation family history.  Significant diagnoses are listed below: Family History  Problem Relation Age of Onset  . Breast cancer Mother 21  . Prostate cancer Father 51  . Breast cancer Paternal Grandmother 59  . Colon cancer Paternal Grandmother 28  . Breast cancer Maternal Aunt 69  . Lung cancer Maternal Grandfather        d. before 26; smoking hx  . Prostate cancer Paternal Grandfather        d. 23      Ms. Fair has two sons, ages 12 and 40.  She has one brother, age 34, and one sister, age 91, both without a cancer history.   Ms. Russella Dar mother is 49 years old and was diagnosed with breast cancer at age 69.  Ms. Bordas maternal aunt has breast cancer diagnosed at age 28.  Ms. Leask mother has not had genetic testing previously. Ms. Newhart maternal grandfather was diagnosed with lung cancer and passed away before the age of 79.   Ms. Kingbird father, age 1, was diagnosed with prostate cancer at age 82.  Ms. Ivanov paternal grandmother had a history of breast (dx 31) and colon (dx 69) cancers and passed away at age 68.  Ms. Kage paternal grandfather had a history of prostate cancer.   Ms. Popowski is unaware of previous family history of genetic testing for hereditary cancer risks. Patient's maternal ancestors are of [place] descent, and paternal ancestors are of [place] descent. There is no reported Ashkenazi Jewish ancestry. There is no known consanguinity.  GENETIC COUNSELING ASSESSMENT: Ms. Wandrey is a 40 y.o. female with a personal and family history of cancer which is somewhat suggestive of predisposition to cancer given the presence of related cancer and young ages of diagnosis.  We, therefore, discussed and recommended the following at today's visit.   DISCUSSION: We discussed that 5 - 10% of cancer is hereditary, with most cases of hereditary breast associated with mutations in BRCA1/2.  There are other genes that can be associated with hereditary breast cancer syndromes.  Type of cancer risk and level of risk are gene-specific.  We discussed that testing is beneficial for several reasons including knowing how to follow individuals after completing their treatment, identifying whether potential treatment options would be beneficial, and understanding if other family members could be at risk for cancer and allowing them to undergo genetic testing.   We also discussed Ms. Blanchett's previous negative genetic testing. We discussed with Ms. Sparr that because current genetic testing is not perfect, it is possible there may be a gene mutation in one of these genes that current testing cannot detect, but that chance is small.  We also discussed, that there could be another gene that has not yet been discovered, or that we have not yet tested, that is responsible for the cancer diagnoses in the family.  It is also possible there is a hereditary cause for the cancer in the family that Ms. Lubas did not inherit and therefore was not identified in her testing.   Given Ms. Nawabi's personal and family histories, we must interpret her negative results with some caution.  Families with features suggestive of hereditary risk for cancer tend to have multiple family members with cancer, diagnoses in multiple generations and diagnoses before the age of 60. Ms. Bonaventura family exhibits some of these features. Thus, this result may simply reflect our current inability to detect all mutations within these genes or there may be a different gene that has not yet been discovered or tested.   An individual's cancer risk and medical management are not determined by genetic  test results alone. Overall cancer risk assessment incorporates additional factors, including personal medical history, family history, and any available genetic information that may result in a personalized plan for cancer prevention and surveillance.  We also discussed updated genetic testing, including the appropriate family members to test, the process of testing, insurance coverage and turn-around-time for results. We discussed the implications of a negative, positive, carrier and/or variant of uncertain significant result. We offered Ms. Naples updated genetic testing, which would include RNA analysis in addition to DNA analysis.  We discussed that, in rare cases, RNA analysis can identify pathogenic variants not detected by DNA analysis alone.   Based on Ms. Caradine's personal history of cancer, she meets medical criteria for genetic testing. Despite that she meets criteria, she may still have an out of pocket cost.   PLAN: Ms. Siegenthaler did not wish to pursue udpated genetic testing at today's visit. We understand this decision and remain available to coordinate genetic testing at any time in the future. We, therefore, recommend Ms. Sluder continue to follow the cancer screening guidelines given by her oncology and primary healthcare providers.  Based on Ms. Blankenbaker's family history, we recommended her mother, who was diagnosed with breast cancer at age 62, have genetic counseling and testing.  We discussed that her sister may benefit from genetic testing if her mother is not interested in testing at this point in time. Ms. Goosby will let us know if we can be of any assistance in coordinating genetic counseling and/or testing for this family member.   Individuals in this family might be at some increased risk of developing cancer, over the general population risk, simply due to the family history of cancer.  We recommended women in this family have a yearly  mammogram beginning at age 12, or 21 years younger than the earliest onset of cancer, an annual clinical breast exam, and perform monthly breast self-exams.   Lastly, we encouraged Ms. Haley to remain in contact with cancer genetics annually so that we can continuously update the family history and inform her of any changes in cancer genetics and testing that may be of benefit for this family.   Ms. Kempa questions were answered to her satisfaction today. Our contact information was provided should additional questions or concerns arise. Thank you for the referral and allowing [place] to share in the care of your patient.   Zanyiah Posten M. Joette Catching, [place], [facility] Genetic Counselor [email or web address] (P) [phone]  The patient was seen for a total of 25 minutes in face-to-face genetic counseling.  Drs. Magrinat, Lindi Adie and/or Burr Medico were available to discuss this case as needed.    _______________________________________________________________________ For Office Staff:  Number of people involved in session: 1 Was an Intern/ student involved  with case: no

## 2021-01-04 DIAGNOSIS — Z6823 Body mass index (BMI) 23.0-23.9, adult: Secondary | ICD-10-CM | POA: Diagnosis not present

## 2021-01-04 DIAGNOSIS — D0511 Intraductal carcinoma in situ of right breast: Secondary | ICD-10-CM | POA: Diagnosis not present

## 2021-01-04 DIAGNOSIS — Z01419 Encounter for gynecological examination (general) (routine) without abnormal findings: Secondary | ICD-10-CM | POA: Diagnosis not present

## 2021-01-04 DIAGNOSIS — Z803 Family history of malignant neoplasm of breast: Secondary | ICD-10-CM | POA: Diagnosis not present

## 2021-01-09 ENCOUNTER — Ambulatory Visit
Admission: RE | Admit: 2021-01-09 | Discharge: 2021-01-09 | Disposition: A | Discharge status: Home / Self Care | Payer: BC Managed Care – PPO | Source: Ambulatory Visit | Attending: Hematology and Oncology | Admitting: Hematology and Oncology

## 2021-01-09 ENCOUNTER — Other Ambulatory Visit: Payer: Self-pay

## 2021-01-09 DIAGNOSIS — K769 Liver disease, unspecified: Secondary | ICD-10-CM | POA: Diagnosis not present

## 2021-01-09 DIAGNOSIS — Z853 Personal history of malignant neoplasm of breast: Secondary | ICD-10-CM | POA: Diagnosis not present

## 2021-01-09 MED ORDER — GADOBENATE DIMEGLUMINE 529 MG/ML IV SOLN
12.0000 mL | Freq: Once | INTRAVENOUS | Status: AC | PRN
  Administered 2021-01-09: 12 mL via INTRAVENOUS

## 2021-01-10 ENCOUNTER — Telehealth: Payer: Self-pay | Admitting: Hematology and Oncology

## 2021-01-10 NOTE — Telephone Encounter (Signed)
I informed her that the liver MRI showed a benign hemangioma. She was very happy to hear the result

## 2021-01-11 ENCOUNTER — Encounter: Payer: Self-pay | Admitting: *Deleted

## 2021-01-13 ENCOUNTER — Other Ambulatory Visit: Payer: BC Managed Care – PPO

## 2021-01-17 ENCOUNTER — Other Ambulatory Visit (HOSPITAL_COMMUNITY)
Admission: RE | Admit: 2021-01-17 | Discharge: 2021-01-17 | Disposition: A | Discharge status: Home / Self Care | Payer: BC Managed Care – PPO | Source: Ambulatory Visit | Attending: General Surgery | Admitting: General Surgery

## 2021-01-17 DIAGNOSIS — Z01812 Encounter for preprocedural laboratory examination: Secondary | ICD-10-CM | POA: Insufficient documentation

## 2021-01-17 DIAGNOSIS — Z20822 Contact with and (suspected) exposure to covid-19: Secondary | ICD-10-CM | POA: Diagnosis not present

## 2021-01-17 LAB — SARS CORONAVIRUS 2 (TAT 6-24 HRS): SARS Coronavirus 2: NEGATIVE

## 2021-01-19 ENCOUNTER — Encounter (HOSPITAL_COMMUNITY): Payer: Self-pay | Admitting: General Surgery

## 2021-01-19 ENCOUNTER — Other Ambulatory Visit: Payer: Self-pay | Admitting: General Surgery

## 2021-01-19 NOTE — Progress Notes (Signed)
   EKG: 03/07/20 - requested tracing.   CXR: na ECHO: denies Stress Test: denies Cardiac Cath: denies  Fasting Blood Sugar- na Checks Blood Sugar__na_ times a day  OSA/CPAP: No  ASA/Blood Thinners: No  Covid test 01/17/21 negative  Anesthesia Review: No.  No history of HTN.  Takes Propranolol for migraines.   Patient denies shortness of breath, fever, cough, and chest pain at PAT appointment.  Patient verbalized understanding of instructions provided today at the PAT appointment.  Patient asked to review instructions at home and day of surgery.

## 2021-01-19 NOTE — Progress Notes (Signed)
Patient has a 0740 appt with plastic surgeon across the street to be marked where surgical incisions should be before coming to admitting.  Dr. Donne Hazel aware.  Will be late

## 2021-01-20 ENCOUNTER — Other Ambulatory Visit: Payer: Self-pay

## 2021-01-20 ENCOUNTER — Observation Stay (HOSPITAL_COMMUNITY)
Admission: RE | Admit: 2021-01-20 | Discharge: 2021-01-21 | Disposition: A | Discharge status: Home / Self Care | Payer: BC Managed Care – PPO | Attending: General Surgery | Admitting: General Surgery

## 2021-01-20 ENCOUNTER — Encounter (HOSPITAL_COMMUNITY): Payer: Self-pay | Admitting: General Surgery

## 2021-01-20 ENCOUNTER — Ambulatory Visit (HOSPITAL_COMMUNITY): Payer: BC Managed Care – PPO | Admitting: Anesthesiology

## 2021-01-20 ENCOUNTER — Encounter (HOSPITAL_COMMUNITY)
Admission: RE | Disposition: A | Discharge status: Home / Self Care | Payer: Self-pay | Source: Home / Self Care | Attending: General Surgery

## 2021-01-20 DIAGNOSIS — D0511 Intraductal carcinoma in situ of right breast: Secondary | ICD-10-CM | POA: Diagnosis not present

## 2021-01-20 DIAGNOSIS — N6082 Other benign mammary dysplasias of left breast: Secondary | ICD-10-CM | POA: Insufficient documentation

## 2021-01-20 DIAGNOSIS — D242 Benign neoplasm of left breast: Secondary | ICD-10-CM | POA: Diagnosis not present

## 2021-01-20 DIAGNOSIS — Z4001 Encounter for prophylactic removal of breast: Secondary | ICD-10-CM | POA: Diagnosis not present

## 2021-01-20 DIAGNOSIS — G8918 Other acute postprocedural pain: Secondary | ICD-10-CM | POA: Diagnosis not present

## 2021-01-20 DIAGNOSIS — Z803 Family history of malignant neoplasm of breast: Secondary | ICD-10-CM | POA: Diagnosis not present

## 2021-01-20 DIAGNOSIS — C50911 Malignant neoplasm of unspecified site of right female breast: Secondary | ICD-10-CM | POA: Diagnosis not present

## 2021-01-20 DIAGNOSIS — Z170 Estrogen receptor positive status [ER+]: Secondary | ICD-10-CM | POA: Diagnosis not present

## 2021-01-20 DIAGNOSIS — C50411 Malignant neoplasm of upper-outer quadrant of right female breast: Secondary | ICD-10-CM | POA: Diagnosis not present

## 2021-01-20 DIAGNOSIS — N6012 Diffuse cystic mastopathy of left breast: Secondary | ICD-10-CM | POA: Diagnosis not present

## 2021-01-20 HISTORY — PX: TOTAL MASTECTOMY: SHX6129

## 2021-01-20 HISTORY — DX: Malignant (primary) neoplasm, unspecified: C80.1

## 2021-01-20 LAB — CBC
HCT: 40 % (ref 36.0–46.0)
Hemoglobin: 13.2 g/dL (ref 12.0–15.0)
MCH: 32.8 pg (ref 26.0–34.0)
MCHC: 33 g/dL (ref 30.0–36.0)
MCV: 99.5 fL (ref 80.0–100.0)
Platelets: 287 10*3/uL (ref 150–400)
RBC: 4.02 MIL/uL (ref 3.87–5.11)
RDW: 11.6 % (ref 11.5–15.5)
WBC: 7.4 10*3/uL (ref 4.0–10.5)
nRBC: 0 % (ref 0.0–0.2)

## 2021-01-20 LAB — BASIC METABOLIC PANEL
Anion gap: 10 (ref 5–15)
BUN: 13 mg/dL (ref 6–20)
CO2: 24 mmol/L (ref 22–32)
Calcium: 9.1 mg/dL (ref 8.9–10.3)
Chloride: 103 mmol/L (ref 98–111)
Creatinine, Ser: 0.73 mg/dL (ref 0.44–1.00)
GFR, Estimated: >60 mL/min (ref >60)
Glucose, Bld: 98 mg/dL (ref 70–99)
Potassium: 4 mmol/L (ref 3.5–5.1)
Sodium: 137 mmol/L (ref 135–145)

## 2021-01-20 LAB — POCT PREGNANCY, URINE: Preg Test, Ur: NEGATIVE

## 2021-01-20 SURGERY — MASTECTOMY, SIMPLE
Anesthesia: General | Site: Breast | Laterality: Bilateral

## 2021-01-20 MED ORDER — ROCURONIUM BROMIDE 10 MG/ML (PF) SYRINGE
PREFILLED_SYRINGE | INTRAVENOUS | Status: AC
  Filled 2021-01-20: qty 10

## 2021-01-20 MED ORDER — DEXAMETHASONE SODIUM PHOSPHATE 10 MG/ML IJ SOLN
INTRAMUSCULAR | Status: DC | PRN
  Administered 2021-01-20: 5 mg via INTRAVENOUS

## 2021-01-20 MED ORDER — FENTANYL CITRATE (PF) 250 MCG/5ML IJ SOLN
INTRAMUSCULAR | Status: DC | PRN
  Administered 2021-01-20 (×2): 50 ug via INTRAVENOUS

## 2021-01-20 MED ORDER — ONDANSETRON 4 MG PO TBDP
4.0000 mg | ORAL_TABLET | Freq: Four times a day (QID) | ORAL | Status: DC | PRN
  Administered 2021-01-20: 4 mg via ORAL
  Filled 2021-01-20: qty 1

## 2021-01-20 MED ORDER — BUPIVACAINE-EPINEPHRINE (PF) 0.5% -1:200000 IJ SOLN
INTRAMUSCULAR | Status: DC | PRN
  Administered 2021-01-20 (×2): 15 mL via PERINEURAL

## 2021-01-20 MED ORDER — LIDOCAINE 2% (20 MG/ML) 5 ML SYRINGE
INTRAMUSCULAR | Status: AC
  Filled 2021-01-20: qty 5

## 2021-01-20 MED ORDER — MIDAZOLAM HCL 2 MG/2ML IJ SOLN
INTRAMUSCULAR | Status: AC
  Filled 2021-01-20: qty 2

## 2021-01-20 MED ORDER — ONDANSETRON HCL 4 MG/2ML IJ SOLN
INTRAMUSCULAR | Status: AC
  Filled 2021-01-20: qty 2

## 2021-01-20 MED ORDER — FENTANYL CITRATE (PF) 100 MCG/2ML IJ SOLN: 25.0000 ug | INTRAMUSCULAR | Status: DC | PRN

## 2021-01-20 MED ORDER — OXYCODONE HCL 5 MG PO TABS: 5.0000 mg | ORAL_TABLET | Freq: Once | ORAL | Status: DC | PRN

## 2021-01-20 MED ORDER — VANCOMYCIN HCL IN DEXTROSE 1-5 GM/200ML-% IV SOLN
1000.0000 mg | INTRAVENOUS | Status: AC
  Administered 2021-01-20: 1000 mg via INTRAVENOUS
  Filled 2021-01-20: qty 200

## 2021-01-20 MED ORDER — SODIUM CHLORIDE 0.9 % IV SOLN: INTRAVENOUS | Status: DC

## 2021-01-20 MED ORDER — KETOROLAC TROMETHAMINE 15 MG/ML IJ SOLN: 15.0000 mg | Freq: Four times a day (QID) | INTRAMUSCULAR | Status: DC | PRN

## 2021-01-20 MED ORDER — PHENYLEPHRINE 40 MCG/ML (10ML) SYRINGE FOR IV PUSH (FOR BLOOD PRESSURE SUPPORT)
PREFILLED_SYRINGE | INTRAVENOUS | Status: AC
  Filled 2021-01-20: qty 10

## 2021-01-20 MED ORDER — DEXAMETHASONE SODIUM PHOSPHATE 10 MG/ML IJ SOLN
INTRAMUSCULAR | Status: AC
  Filled 2021-01-20: qty 1

## 2021-01-20 MED ORDER — MIDAZOLAM HCL 2 MG/2ML IJ SOLN: 2.0000 mg | Freq: Once | INTRAMUSCULAR | Status: AC

## 2021-01-20 MED ORDER — SIMETHICONE 80 MG PO CHEW: 40.0000 mg | CHEWABLE_TABLET | Freq: Four times a day (QID) | ORAL | Status: DC | PRN

## 2021-01-20 MED ORDER — ONDANSETRON HCL 4 MG/2ML IJ SOLN: 4.0000 mg | Freq: Four times a day (QID) | INTRAMUSCULAR | Status: DC | PRN

## 2021-01-20 MED ORDER — ACETAMINOPHEN 500 MG PO TABS
1000.0000 mg | ORAL_TABLET | ORAL | Status: AC
  Administered 2021-01-20: 1000 mg via ORAL
  Filled 2021-01-20: qty 2

## 2021-01-20 MED ORDER — OXYCODONE HCL 5 MG/5ML PO SOLN
5.0000 mg | Freq: Once | ORAL | Status: DC | PRN
Start: 2021-01-20 — End: 2021-01-20

## 2021-01-20 MED ORDER — HEMOSTATIC AGENTS (NO CHARGE) OPTIME
TOPICAL | Status: DC | PRN
  Administered 2021-01-20: 4 via TOPICAL

## 2021-01-20 MED ORDER — 0.9 % SODIUM CHLORIDE (POUR BTL) OPTIME
TOPICAL | Status: DC | PRN
  Administered 2021-01-20: 1000 mL

## 2021-01-20 MED ORDER — PROPOFOL 10 MG/ML IV BOLUS
INTRAVENOUS | Status: AC
  Filled 2021-01-20: qty 20

## 2021-01-20 MED ORDER — FENTANYL CITRATE (PF) 100 MCG/2ML IJ SOLN: 50.0000 ug | Freq: Once | INTRAMUSCULAR | Status: AC

## 2021-01-20 MED ORDER — LACTATED RINGERS IV SOLN: INTRAVENOUS | Status: DC

## 2021-01-20 MED ORDER — OXYCODONE HCL 5 MG PO TABS: 5.0000 mg | ORAL_TABLET | ORAL | Status: DC | PRN

## 2021-01-20 MED ORDER — MORPHINE SULFATE (PF) 2 MG/ML IV SOLN: 1.0000 mg | INTRAVENOUS | Status: DC | PRN

## 2021-01-20 MED ORDER — ENSURE PRE-SURGERY PO LIQD: 296.0000 mL | Freq: Once | ORAL | Status: DC

## 2021-01-20 MED ORDER — ACETAMINOPHEN 500 MG PO TABS
1000.0000 mg | ORAL_TABLET | Freq: Four times a day (QID) | ORAL | Status: DC
  Administered 2021-01-20 – 2021-01-21 (×3): 1000 mg via ORAL
  Filled 2021-01-20 (×3): qty 2

## 2021-01-20 MED ORDER — FENTANYL CITRATE (PF) 250 MCG/5ML IJ SOLN
INTRAMUSCULAR | Status: AC
  Filled 2021-01-20: qty 5

## 2021-01-20 MED ORDER — KETOROLAC TROMETHAMINE 15 MG/ML IJ SOLN
15.0000 mg | INTRAMUSCULAR | Status: AC
  Administered 2021-01-20: 15 mg via INTRAVENOUS
  Filled 2021-01-20: qty 1

## 2021-01-20 MED ORDER — LIDOCAINE 2% (20 MG/ML) 5 ML SYRINGE
INTRAMUSCULAR | Status: DC | PRN
  Administered 2021-01-20: 50 mg via INTRAVENOUS

## 2021-01-20 MED ORDER — FENTANYL CITRATE (PF) 100 MCG/2ML IJ SOLN
INTRAMUSCULAR | Status: AC
  Administered 2021-01-20: 25 ug via INTRAVENOUS
  Filled 2021-01-20: qty 2

## 2021-01-20 MED ORDER — MIDAZOLAM HCL 2 MG/2ML IJ SOLN
INTRAMUSCULAR | Status: DC | PRN
  Administered 2021-01-20: 2 mg via INTRAVENOUS

## 2021-01-20 MED ORDER — FENTANYL CITRATE (PF) 100 MCG/2ML IJ SOLN
INTRAMUSCULAR | Status: AC
  Administered 2021-01-20: 50 ug via INTRAVENOUS
  Filled 2021-01-20: qty 2

## 2021-01-20 MED ORDER — PROPOFOL 10 MG/ML IV BOLUS
INTRAVENOUS | Status: DC | PRN
  Administered 2021-01-20: 180 mg via INTRAVENOUS

## 2021-01-20 MED ORDER — METHOCARBAMOL 500 MG PO TABS: 500.0000 mg | ORAL_TABLET | Freq: Four times a day (QID) | ORAL | Status: DC | PRN

## 2021-01-20 MED ORDER — ONDANSETRON HCL 4 MG/2ML IJ SOLN
INTRAMUSCULAR | Status: DC | PRN
  Administered 2021-01-20: 4 mg via INTRAVENOUS

## 2021-01-20 MED ORDER — PROPRANOLOL HCL 20 MG PO TABS
20.0000 mg | ORAL_TABLET | Freq: Two times a day (BID) | ORAL | Status: DC
  Administered 2021-01-20: 20 mg via ORAL
  Filled 2021-01-20: qty 1

## 2021-01-20 MED ORDER — PHENYLEPHRINE 40 MCG/ML (10ML) SYRINGE FOR IV PUSH (FOR BLOOD PRESSURE SUPPORT)
PREFILLED_SYRINGE | INTRAVENOUS | Status: DC | PRN
  Administered 2021-01-20: 40 ug via INTRAVENOUS
  Administered 2021-01-20: 120 ug via INTRAVENOUS
  Administered 2021-01-20: 40 ug via INTRAVENOUS
  Administered 2021-01-20: 80 ug via INTRAVENOUS
  Administered 2021-01-20: 120 ug via INTRAVENOUS

## 2021-01-20 MED ORDER — MIDAZOLAM HCL 2 MG/2ML IJ SOLN
INTRAMUSCULAR | Status: AC
  Administered 2021-01-20: 2 mg via INTRAVENOUS
  Filled 2021-01-20: qty 2

## 2021-01-20 MED ORDER — CHLORHEXIDINE GLUCONATE 0.12 % MT SOLN
15.0000 mL | OROMUCOSAL | Status: AC
  Administered 2021-01-20: 15 mL via OROMUCOSAL
  Filled 2021-01-20 (×2): qty 15

## 2021-01-20 SURGICAL SUPPLY — 38 items
APPLIER CLIP 9.375 MED OPEN (MISCELLANEOUS) ×2
BINDER BREAST LRG (GAUZE/BANDAGES/DRESSINGS) ×2 IMPLANT
BIOPATCH RED 1 DISK 7.0 (GAUZE/BANDAGES/DRESSINGS) ×2 IMPLANT
CHLORAPREP W/TINT 26 (MISCELLANEOUS) ×2 IMPLANT
CLIP APPLIE 9.375 MED OPEN (MISCELLANEOUS) ×1 IMPLANT
COVER SURGICAL LIGHT HANDLE (MISCELLANEOUS) ×2 IMPLANT
COVER WAND RF STERILE (DRAPES) ×2 IMPLANT
DERMABOND ADVANCED (GAUZE/BANDAGES/DRESSINGS) ×1
DERMABOND ADVANCED .7 DNX12 (GAUZE/BANDAGES/DRESSINGS) ×1 IMPLANT
DRAIN CHANNEL 19F RND (DRAIN) ×4 IMPLANT
DRAPE CHEST BREAST 15X10 FENES (DRAPES) ×2 IMPLANT
DRSG PAD ABDOMINAL 8X10 ST (GAUZE/BANDAGES/DRESSINGS) ×2 IMPLANT
DRSG TEGADERM 4X4.75 (GAUZE/BANDAGES/DRESSINGS) ×2 IMPLANT
ELECT BLADE 4.0 EZ CLEAN MEGAD (MISCELLANEOUS) ×2
ELECT CAUTERY BLADE 6.4 (BLADE) ×2 IMPLANT
ELECT REM PT RETURN 9FT ADLT (ELECTROSURGICAL) ×2
ELECTRODE BLDE 4.0 EZ CLN MEGD (MISCELLANEOUS) ×1 IMPLANT
ELECTRODE REM PT RTRN 9FT ADLT (ELECTROSURGICAL) ×1 IMPLANT
EVACUATOR SILICONE 100CC (DRAIN) ×2 IMPLANT
GLOVE BIO SURGEON STRL SZ7 (GLOVE) ×2 IMPLANT
GLOVE BIOGEL PI IND STRL 7.5 (GLOVE) ×1 IMPLANT
GLOVE BIOGEL PI INDICATOR 7.5 (GLOVE) ×1
GOWN STRL REUS W/ TWL LRG LVL3 (GOWN DISPOSABLE) ×3 IMPLANT
GOWN STRL REUS W/TWL LRG LVL3 (GOWN DISPOSABLE) ×3
HEMOSTAT ARISTA ABSORB 3G PWDR (HEMOSTASIS) ×8 IMPLANT
KIT BASIN OR (CUSTOM PROCEDURE TRAY) ×2 IMPLANT
KIT TURNOVER KIT B (KITS) ×2 IMPLANT
NS IRRIG 1000ML POUR BTL (IV SOLUTION) ×2 IMPLANT
PACK GENERAL/GYN (CUSTOM PROCEDURE TRAY) ×2 IMPLANT
PAD ARMBOARD 7.5X6 YLW CONV (MISCELLANEOUS) ×2 IMPLANT
PENCIL SMOKE EVACUATOR (MISCELLANEOUS) ×2 IMPLANT
STRIP CLOSURE SKIN 1/2X4 (GAUZE/BANDAGES/DRESSINGS) ×2 IMPLANT
SUT ETHILON 2 0 FS 18 (SUTURE) ×4 IMPLANT
SUT MNCRL AB 4-0 PS2 18 (SUTURE) ×4 IMPLANT
SUT VIC AB 2-0 SH 18 (SUTURE) ×2 IMPLANT
SUT VIC AB 3-0 SH 18 (SUTURE) ×4 IMPLANT
TOWEL GREEN STERILE (TOWEL DISPOSABLE) ×2 IMPLANT
TOWEL GREEN STERILE FF (TOWEL DISPOSABLE) ×2 IMPLANT

## 2021-01-20 NOTE — Anesthesia Procedure Notes (Addendum)
Procedure Name: LMA Insertion Date/Time: 01/20/2021 9:58 AM Performed by: Barrington Ellison, CRNA Pre-anesthesia Checklist: Patient identified, Emergency Drugs available, Suction available and Patient being monitored Patient Re-evaluated:Patient Re-evaluated prior to induction Oxygen Delivery Method: Circle System Utilized Preoxygenation: Pre-oxygenation with 100% oxygen Induction Type: IV induction Ventilation: Mask ventilation without difficulty LMA: LMA inserted LMA Size: 4.0 Number of attempts: 1 Placement Confirmation: positive ETCO2 Tube secured with: Tape Dental Injury: Teeth and Oropharynx as per pre-operative assessment

## 2021-01-20 NOTE — H&P (Signed)
40 yof with family history of bca in her mom at age 40 (stage 11) and maternal aunt age 40 who has had negative biopsies on her left breast in past presents with new calcifications in right breast. there were 3 mm of calcifications in the ruoq. MRI shows no additional disease, normal nodes. biopsy is er pos, pr neg hg DCIS. 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 takes care of her 2 boys now 6/3. lives in [place]. she is here to discuss options. she has discussed with oncology already.   Past Surgical History Carrolyn Leigh, [place]; 01-13-2021 10:22 AM) Breast Biopsy  Right.  Diagnostic Studies History Carrolyn Leigh, [place]; 2021/01/13 10:22 AM) Mammogram  within last year Pap Smear  1-5 years ago  Allergies Carrolyn Leigh, CMA; 13-Jan-2021 10:24 AM) Penicillin G Potassium *PENICILLINS*   Medication History Carrolyn Leigh, CMA; 13-Jan-2021 10:25 AM) Propranolol HCl (40MG  Tablet, Oral) Active. Propranolol HCl (20MG  Tablet, Oral) Active. clonazePAM (0.5MG  Tablet, Oral) Active. DHA-EPA-Coenzyme Q10-Vitamin E (120-180-50-30 Capsule, Oral) Active. Vitamin D-3 (5000UNIT/ML Liquid, Sublingual) Active.  Social History Carrolyn Leigh, [place]; 01-13-21 10:22 AM) Alcohol use  Occasional alcohol use. Caffeine use  Coffee. No drug use  Tobacco use  Never smoker.  Family History Carrolyn Leigh, [place]; Jan 13, 2021 10:22 AM) Breast Cancer  Family Members In General. Diabetes Mellitus  Family Members In General. Hypertension  Father. Respiratory Condition  Family Members In General.  Pregnancy / Birth History Carrolyn Leigh, [place]; Jan 13, 2021 10:22 AM) Contraceptive History  Oral contraceptives. Length (months) of breastfeeding  12-24 Maternal age  68-35 Para  2 Regular periods   Other Problems Carrolyn Leigh, CMA; 01/13/2021 10:22 AM) Breast Cancer  Lump In Breast  Migraine Headache     Review of Systems Arville Go Fox CMA; 2021/01/13 10:22 AM) General Not Present- Appetite Loss, Chills, Fatigue, Fever, Night Sweats, Weight Gain  and Weight Loss. Skin Not Present- Change in Wart/Mole, Dryness, Hives, Jaundice, New Lesions, Non-Healing Wounds, Rash and Ulcer. HEENT Present- Wears glasses/contact lenses. Not Present- Earache, Hearing Loss, Hoarseness, Nose Bleed, Oral Ulcers, Ringing in the Ears, Seasonal Allergies, Sinus Pain, Sore Throat, Visual Disturbances and Yellow Eyes. Respiratory Not Present- Bloody sputum, Chronic Cough, Difficulty Breathing, Snoring and Wheezing. Breast Present- Breast Mass. Not Present- Breast Pain, Nipple Discharge and Skin Changes. Cardiovascular Not Present- Chest Pain, Difficulty Breathing Lying Down, Leg Cramps, Palpitations, Rapid Heart Rate, Shortness of Breath and Swelling of Extremities. Gastrointestinal Not Present- Abdominal Pain, Bloating, Bloody Stool, Change in Bowel Habits, Chronic diarrhea, Constipation, Difficulty Swallowing, Excessive gas, Gets full quickly at meals, Hemorrhoids, Indigestion, Nausea, Rectal Pain and Vomiting. Female Genitourinary Not Present- Frequency, Nocturia, Painful Urination, Pelvic Pain and Urgency. Musculoskeletal Not Present- Back Pain, Joint Pain, Joint Stiffness, Muscle Pain, Muscle Weakness and Swelling of Extremities. Neurological Present- Headaches. Not Present- Decreased Memory, Fainting, Numbness, Seizures, Tingling, Tremor, Trouble walking and Weakness. Psychiatric Not Present- Anxiety, Bipolar, Change in Sleep Pattern, Depression, Fearful and Frequent crying. Endocrine Not Present- Cold Intolerance, Excessive Hunger, Hair Changes, Heat Intolerance, Hot flashes and New Diabetes. Hematology Not Present- Blood Thinners, Easy Bruising, Excessive bleeding, Gland problems, HIV and Persistent Infections.  Vitals Arville Go Fox CMA; Jan 13, 2021 10:23 AM) 2021-01-13 10:23 AM Weight: 130.5 lb Temp.: 97F  Pulse: 73 (Regular)  P.OX: 98% (Room air) BP: 124/68(Sitting, Left Arm, Standard)       Physical Exam Rolm Bookbinder MD; Jan 13, 2021 12:08  PM) General Mental Status-Alert. Orientation-Oriented X3.  Breast Nipples-No Discharge. Breast Lump-No Palpable Breast Mass.  Lymphatic Head & Neck  General  Head & Neck Lymphatics: Bilateral - Description - Normal. Axillary  General Axillary Region: Bilateral - Description - Normal. Note: no Kirby adenopathy     Assessment & Plan Rolm Bookbinder MD; 12/31/2020 12:15 PM) BREAST NEOPLASM, TIS (DCIS), RIGHT (D05.11) Story: Bilateral mastectomies her genetics was done in 2017 and is up to date. no need for further testing. We discussed the staging and pathophysiology of breast cancer. We discussed all of the different options for treatment for breast cancer including surgery, chemotherapy, radiation therapy, Herceptin, and antiestrogen therapy. we discussed dcis is precancerous lesion. hers is one that would likely progress given high grade nature. I recommended surgery. We discussed the options for treatment of the breast cancer which included lumpectomy versus a mastectomy. We also discussed that she will need radiation therapy if she undergoes lumpectomy. We discussed mastectomy and the postoperative care for that as well. Mastectomy can be followed by reconstruction. . Most mastectomy patients will not need radiation therapy. We discussed that there is no difference in her survival whether she undergoes lumpectomy with radiation therapy and/or antiestrogen therapy versus a mastectomy. There is also no real difference between her recurrence in the breast. due to [place] she desires mastectomy on the right side and risk reducing on left. I dont think this is unreasonable given fh and her age although there is no real benefit to this. We discussed all types of mastectomy with/without reconstruction. she will talk to plastic surgery but likely will just proceed with mastectomies (likely anchor incisions). we also discussed sn biopsy and indication for that with dcis and mastectomy. risk of  sn biopsy is lymphedema, shoulder dysfunction, neuropathy. I think that the risks of sn biopsy outweigh the possibility of her having invasive disease and then a pos node given 3 mm of dcis with nothing else on mri. we discussed omitting sn biopsy. i think this is reasonable given her age, activity level and two young sons. she is going to consider. she understands most conservative approach is sn biopsy at time of surgery we discussed recovery also. She has elected to forgo reconstruction after seeing plastics and we will omit sn biopsy due to low pretest probability

## 2021-01-20 NOTE — Anesthesia Procedure Notes (Signed)
Anesthesia Regional Block: Pectoralis block   Pre-Anesthetic Checklist: ,, timeout performed, Correct Patient, Correct Site, Correct Laterality, Correct Procedure, Correct Position, site marked, Risks and benefits discussed,  Surgical consent,  Pre-op evaluation,  At surgeon's request and post-op pain management  Laterality: Left  Prep: chloraprep       Needles:  Injection technique: Single-shot  Needle Type: Echogenic Needle     Needle Length: 9cm  Needle Gauge: 21     Additional Needles:   Narrative:  Start time: 01/20/2021 9:26 AM End time: 01/20/2021 9:31 AM Injection made incrementally with aspirations every 5 mL.  Performed by: Personally  Anesthesiologist: Albertha Ghee, MD  Additional Notes: Pt tolerated the procedure well.

## 2021-01-20 NOTE — Anesthesia Preprocedure Evaluation (Signed)
Anesthesia Evaluation  Patient identified by MRN, date of birth, ID band Patient awake    Reviewed: Allergy & Precautions, H&P , NPO status , Patient's Chart, lab work & pertinent test results  Airway Mallampati: II   Neck ROM: full    Dental   Pulmonary neg pulmonary ROS,    breath sounds clear to auscultation       Cardiovascular negative cardio ROS   Rhythm:regular Rate:Normal     Neuro/Psych  Headaches,    GI/Hepatic   Endo/Other    Renal/GU      Musculoskeletal   Abdominal   Peds  Hematology   Anesthesia Other Findings   Reproductive/Obstetrics Breast CA                             Anesthesia Physical Anesthesia Plan  ASA: II  Anesthesia Plan: General   Post-op Pain Management:  Regional for Post-op pain   Induction: Intravenous  PONV Risk Score and Plan: 3 and Ondansetron, Dexamethasone, Midazolam and Treatment may vary due to age or medical condition  Airway Management Planned: LMA  Additional Equipment:   Intra-op Plan:   Post-operative Plan: Extubation in OR  Informed Consent: I have reviewed the patients History and Physical, chart, labs and discussed the procedure including the risks, benefits and alternatives for the proposed anesthesia with the patient or authorized representative who has indicated his/her understanding and acceptance.     Dental advisory given  Plan Discussed with: CRNA, Anesthesiologist and Surgeon  Anesthesia Plan Comments:         Anesthesia Quick Evaluation

## 2021-01-20 NOTE — Discharge Instructions (Signed)
[facility] °[phone] ° °MASTECTOMY: POST OP INSTRUCTIONS °Take 400 mg of ibuprofen every 8 hours or 650 mg tylenol every 6 hours for next 72 hours then as needed. Use ice several times daily also. °Always review your discharge instruction sheet given to you by the facility where your surgery was performed. ° °IF YOU HAVE DISABILITY OR FAMILY LEAVE FORMS, YOU MUST BRING THEM TO THE OFFICE FOR PROCESSING.   °DO NOT GIVE THEM TO YOUR DOCTOR. °A prescription for pain medication may be given to you upon discharge.  Take your pain medication as prescribed, if needed.  If narcotic pain medicine is not needed, then you may take acetaminophen (Tylenol), naprosyn (Alleve) or ibuprofen (Advil) as needed. °1. Take your usually prescribed medications unless otherwise directed. °2. If you need a refill on your pain medication, please contact your pharmacy.  They will contact our office to request authorization.  Prescriptions will not be filled after 5pm or on week-ends. °3. You should follow a light diet the first few days after arrival home, such as soup and crackers, etc.  Resume your normal diet the day after surgery. °4. Most patients will experience some swelling and bruising on the chest and underarm.  Ice packs will help.  Swelling and bruising can take several days to resolve. Wear the binder day and night until you return to the office.  °5. It is common to experience some constipation if taking pain medication after surgery.  Increasing fluid intake and taking a stool softener (such as Colace) will usually help or prevent this problem from occurring.  A mild laxative (Milk of Magnesia or Miralax) should be taken according to package instructions if there are no bowel movements after 48 hours. °6. Unless discharge instructions indicate otherwise, leave your bandage dry and in place until your next appointment in 3-5 days.  You may take a limited sponge bath.  No tube baths or showers until the  drains are removed.  You may have steri-strips (small skin tapes) in place directly over the incision.  These strips should be left on the skin for 7-10 days. If you have glue it will come off in next couple week.  Any sutures will be removed at an office visit °7. DRAINS:  If you have drains in place, it is important to keep a list of the amount of drainage produced each day in your drains.  Before leaving the hospital, you should be instructed on drain care.  Call our office if you have any questions about your drains. I will remove your drains when they put out less than 30 cc or ml for 2 consecutive days. °8. ACTIVITIES:  You may resume regular (light) daily activities beginning the next day--such as daily self-care, walking, climbing stairs--gradually increasing activities as tolerated.  You may have sexual intercourse when it is comfortable.  Refrain from any heavy lifting or straining until approved by your doctor. °a. You may drive when you are no longer taking prescription pain medication, you can comfortably wear a seatbelt, and you can safely maneuver your car and apply brakes. °b. RETURN TO WORK:  __________________________________________________________ °9. You should see your doctor in the office for a follow-up appointment approximately 3-5 days after your surgery.  Your doctor’s nurse will typically make your follow-up appointment when she calls you with your pathology report.  Expect your pathology report 3-4business days after surgery. °10. OTHER INSTRUCTIONS: ______________________________________________________________________________________________ ____________________________________________________________________________________________ °WHEN TO CALL YOUR DR Leilani Cespedes: °1. Fever over 101.0 °  2. Nausea and/or vomiting °3. Extreme swelling or bruising °4. Continued bleeding from incision. °5. Increased pain, redness, or drainage from the incision. °The clinic staff is available to answer your  questions during regular business hours.  Please don’t hesitate to call and ask to speak to one of the nurses for clinical concerns.  If you have a medical emergency, go to the nearest emergency room or call 911.  A surgeon from [facility] is always on call at the hospital. °[address], [address], [place], [place]  [address] ? [address] [address], [place], [place]   [address] °[phone] ? [phone] ? FAX [phone] °Web site: [email or web address] ° °

## 2021-01-20 NOTE — Transfer of Care (Signed)
Immediate Anesthesia Transfer of Care Note  Patient: Lori Best  Procedure(s) Performed: BILATERAL MASTECTOMY (Bilateral Breast)  Patient Location: PACU  Anesthesia Type:General and Regional  Level of Consciousness: drowsy and patient cooperative  Airway & Oxygen Therapy: Patient Spontanous Breathing and Patient connected to nasal cannula oxygen  Post-op Assessment: Report given to RN  Post vital signs: Reviewed and stable  Last Vitals:  Vitals Value Taken Time  BP 110/87 01/20/21 1159  Temp    Pulse 80 01/20/21 1200  Resp 11 01/20/21 1200  SpO2 100 % 01/20/21 1200  Vitals shown include unvalidated device data.  Last Pain:  Vitals:   01/20/21 0930  TempSrc:   PainSc: 0-No pain      Patients Stated Pain Goal: 0 (09/25/82 4621)  Complications: No complications documented.

## 2021-01-20 NOTE — Op Note (Signed)
Preoperative diagnosis: Clinical stage 0 right breast cancer Postoperative diagnosis: Same as above Procedure: 1.  Right mastectomy 2.  Left risk reducing mastectomy Surgeon: Dr. Serita Grammes Anesthesia: General with pectoral blocks Estimated blood loss: 100 cc Specimens: 1.  Left breast tissue marked short stitch superior, long stitch lateral 2.  Right breast tissue marked short stitch superior, long stitch lateral Drains: 61 [place] Blake drain to either side Complications: None Special count was correct at completion Disposition to recovery stable condition  Indications:39 yof with family history of bca in her mom at age 74 (stage 40) and maternal aunt age 95 who has had negative biopsies on her left breast in past presents with new calcifications in right breast. there were 3 mm of calcifications in the ruoq. MRI shows no additional disease, normal nodes. biopsy is er pos, pr neg hg DCIS.  Due to the family history in her mom at a young age we have elected proceed with bilateral mastectomies.  She has declined reconstruction.  Procedure: After informed consent was obtained the patient was first marked by plastic surgery for an incision to create a flap closure eventually.  She was given antibiotics.  SCDs were placed.  She underwent bilateral pectoral blocks.  She was then placed under general anesthesia without complication.  She was prepped and draped in the standard sterile surgical fashion.  Surgical timeout was then performed.  I first did the risk reducing side on the left.  I made an incision in the Wise pattern.  This had been previously marked.  I then created flaps to the parasternal region, clavicle, inframammary fold, latissimus laterally.  The breast and the pectoralis fashion were then removed from the pectoralis muscle.  This was marked as above and passed off the table.  I then obtained hemostasis.  I eventually placed a 27 [place] Blake drain and secured this with a 2-0  nylon suture.  I placed some Arista in the cavity.  I then closed the lateral portion down with 3-0 Vicryl sutures to the chest wall.  I then closed the incision with 3-0 Vicryl and 4-0 Monocryl.  Glue and Steri-Strips were applied.  I then proceeded to do the right side.  I used the similar incision and created flaps in a similar fashion.  The breast and the pectoralis fashion were then removed.  These were marked as above and passed off the table.  I then obtained hemostasis.  A 19 [place] Blake drain was placed and secured with a 2-0 nylon suture.  I then closed the lateral portion of the chest wall down with 3-0 Vicryl.  The skin was closed with 3-0 Vicryl and 4-0 Monocryl.  Glue and Steri-Strips were applied.  She tolerated this well was extubated transferred to recovery stable.Marland Kitchen

## 2021-01-20 NOTE — Interval H&P Note (Signed)
History and Physical Interval Note:  01/20/2021 9:17 AM  Lori Best  has presented today for surgery, with the diagnosis of DCIS, FAMILY HISTORY OF BREAST CANCER.  The various methods of treatment have been discussed with the patient and family. After consideration of risks, benefits and other options for treatment, the patient has consented to  Procedure(s): BILATERAL MASTECTOMY (Bilateral) as a surgical intervention.  The patient's history has been reviewed, patient examined, no change in status, stable for surgery.  I have reviewed the patient's chart and labs.  Questions were answered to the patient's satisfaction.     Rolm Bookbinder

## 2021-01-20 NOTE — Plan of Care (Signed)
  Problem: Education: Goal: Knowledge of General Education information will improve Description: Including pain rating scale, medication(s)/side effects and non-pharmacologic comfort measures Outcome: Progressing  Blood pressure 110/72, pulse 68, temperature 98.2 F (36.8 C), temperature source Oral, resp. rate 15, height 5\' 2"  (1.575 m), weight 59 kg, SpO2 98 %. A&Ox4, denies nausea pain 2/10, Educated on using incentive spirometer,  How to empty bulb suction, advantage of ambulating . Encouraged to request pain med as needed

## 2021-01-20 NOTE — Anesthesia Procedure Notes (Signed)
Anesthesia Regional Block: Pectoralis block   Pre-Anesthetic Checklist: ,, timeout performed, Correct Patient, Correct Site, Correct Laterality, Correct Procedure, Correct Position, site marked, Risks and benefits discussed,  Surgical consent,  Pre-op evaluation,  At surgeon's request and post-op pain management  Laterality: Right  Prep: chloraprep       Needles:  Injection technique: Single-shot  Needle Type: Echogenic Needle     Needle Length: 9cm  Needle Gauge: 21     Additional Needles:   Narrative:  Start time: 01/20/2021 9:20 AM End time: 01/20/2021 9:26 AM Injection made incrementally with aspirations every 5 mL.  Performed by: Personally  Anesthesiologist: Albertha Ghee, MD  Additional Notes: Pt tolerated the procedure well.

## 2021-01-21 ENCOUNTER — Encounter (HOSPITAL_COMMUNITY): Payer: Self-pay | Admitting: General Surgery

## 2021-01-21 DIAGNOSIS — N6082 Other benign mammary dysplasias of left breast: Secondary | ICD-10-CM | POA: Diagnosis not present

## 2021-01-21 DIAGNOSIS — D0511 Intraductal carcinoma in situ of right breast: Secondary | ICD-10-CM | POA: Diagnosis not present

## 2021-01-21 NOTE — Plan of Care (Signed)
  Problem: Education: Goal: Knowledge of General Education information will improve Description: Including pain rating scale, medication(s)/side effects and non-pharmacologic comfort measures 01/21/2021 1100 by Aaban Griep A, RN Outcome: Adequate for Discharge 01/21/2021 1100 by Alvaretta Eisenberger A, RN Outcome: Adequate for Discharge   Problem: Health Behavior/Discharge Planning: Goal: Ability to manage health-related needs will improve 01/21/2021 1100 by Ridgely Anastacio A, RN Outcome: Adequate for Discharge 01/21/2021 1100 by Mehmet Scally A, RN Outcome: Adequate for Discharge   Problem: Clinical Measurements: Goal: Ability to maintain clinical measurements within normal limits will improve 01/21/2021 1100 by Pam Vanalstine A, RN Outcome: Adequate for Discharge 01/21/2021 1100 by Helvi Royals A, RN Outcome: Adequate for Discharge Goal: Will remain free from infection 01/21/2021 1100 by Naveh Rickles A, RN Outcome: Adequate for Discharge 01/21/2021 1100 by Savvas Roper A, RN Outcome: Adequate for Discharge Goal: Diagnostic test results will improve 01/21/2021 1100 by Baer Hinton A, RN Outcome: Adequate for Discharge 01/21/2021 1100 by Envi Eagleson A, RN Outcome: Adequate for Discharge Goal: Respiratory complications will improve 01/21/2021 1100 by Keyshun Elpers A, RN Outcome: Adequate for Discharge 01/21/2021 1100 by Mardie Kellen A, RN Outcome: Adequate for Discharge Goal: Cardiovascular complication will be avoided 01/21/2021 1100 by Treacy Holcomb A, RN Outcome: Adequate for Discharge 01/21/2021 1100 by Jacia Sickman A, RN Outcome: Adequate for Discharge   Problem: Activity: Goal: Risk for activity intolerance will decrease 01/21/2021 1100 by Fatisha Rabalais A, RN Outcome: Adequate for Discharge 01/21/2021 1100 by Keturah Yerby A, RN Outcome: Adequate for Discharge   Problem: Nutrition: Goal: Adequate nutrition will be maintained 01/21/2021 1100 by Stark Aguinaga A, RN Outcome: Adequate for  Discharge 01/21/2021 1100 by Inella Kuwahara A, RN Outcome: Adequate for Discharge   Problem: Coping: Goal: Level of anxiety will decrease 01/21/2021 1100 by Nasira Janusz A, RN Outcome: Adequate for Discharge 01/21/2021 1100 by Worley Radermacher A, RN Outcome: Adequate for Discharge   Problem: Elimination: Goal: Will not experience complications related to bowel motility 01/21/2021 1100 by Jameica Couts A, RN Outcome: Adequate for Discharge 01/21/2021 1100 by Jyaire Koudelka A, RN Outcome: Adequate for Discharge Goal: Will not experience complications related to urinary retention 01/21/2021 1100 by Herbert Aguinaldo A, RN Outcome: Adequate for Discharge 01/21/2021 1100 by Davone Shinault A, RN Outcome: Adequate for Discharge   Problem: Pain Managment: Goal: General experience of comfort will improve 01/21/2021 1100 by Chancellor Vanderloop A, RN Outcome: Adequate for Discharge 01/21/2021 1100 by Tonnia Bardin A, RN Outcome: Adequate for Discharge   Problem: Safety: Goal: Ability to remain free from injury will improve 01/21/2021 1100 by Stephie Xu A, RN Outcome: Adequate for Discharge 01/21/2021 1100 by Saia Derossett A, RN Outcome: Adequate for Discharge   Problem: Skin Integrity: Goal: Risk for impaired skin integrity will decrease 01/21/2021 1100 by Tirrell Buchberger A, RN Outcome: Adequate for Discharge 01/21/2021 1100 by Maydell Knoebel A, RN Outcome: Adequate for Discharge   

## 2021-01-21 NOTE — Anesthesia Postprocedure Evaluation (Signed)
Anesthesia Post Note  Patient: Scientist, research (life sciences)  Procedure(s) Performed: BILATERAL MASTECTOMY (Bilateral Breast)     Patient location during evaluation: PACU Anesthesia Type: General and Regional Level of consciousness: awake and alert Pain management: pain level controlled Vital Signs Assessment: post-procedure vital signs reviewed and stable Respiratory status: spontaneous breathing, nonlabored ventilation, respiratory function stable and patient connected to nasal cannula oxygen Cardiovascular status: blood pressure returned to baseline and stable Postop Assessment: no apparent nausea or vomiting Anesthetic complications: no   No complications documented.  Last Vitals:  Vitals:   01/21/21 0248 01/21/21 0656  BP: 107/68 (!) 142/99  Pulse: 63 68  Resp: 18 18  Temp: 36.9 C 37.2 C  SpO2: 100% 100%    Last Pain:  Vitals:   01/21/21 0656  TempSrc: Oral  PainSc:                  [place]

## 2021-01-21 NOTE — Plan of Care (Signed)
  Problem: Education: Goal: Knowledge of General Education information will improve Description: Including pain rating scale, medication(s)/side effects and non-pharmacologic comfort measures 01/21/2021 1100 by Lurline Idol, RN Outcome: Adequate for Discharge 01/21/2021 1100 by Lurline Idol, RN Outcome: Adequate for Discharge   Problem: Health Behavior/Discharge Planning: Goal: Ability to manage health-related needs will improve 01/21/2021 1100 by Lurline Idol, RN Outcome: Adequate for Discharge 01/21/2021 1100 by Lurline Idol, RN Outcome: Adequate for Discharge   Problem: Clinical Measurements: Goal: Ability to maintain clinical measurements within normal limits will improve 01/21/2021 1100 by Lurline Idol, RN Outcome: Adequate for Discharge 01/21/2021 1100 by Lurline Idol, RN Outcome: Adequate for Discharge Goal: Will remain free from infection 01/21/2021 1100 by Lurline Idol, RN Outcome: Adequate for Discharge 01/21/2021 1100 by Lurline Idol, RN Outcome: Adequate for Discharge Goal: Diagnostic test results will improve 01/21/2021 1100 by Lurline Idol, RN Outcome: Adequate for Discharge 01/21/2021 1100 by Lurline Idol, RN Outcome: Adequate for Discharge Goal: Respiratory complications will improve 01/21/2021 1100 by Lurline Idol, RN Outcome: Adequate for Discharge 01/21/2021 1100 by Lurline Idol, RN Outcome: Adequate for Discharge Goal: Cardiovascular complication will be avoided 01/21/2021 1100 by Lurline Idol, RN Outcome: Adequate for Discharge 01/21/2021 1100 by Lurline Idol, RN Outcome: Adequate for Discharge   Problem: Activity: Goal: Risk for activity intolerance will decrease 01/21/2021 1100 by Lurline Idol, RN Outcome: Adequate for Discharge 01/21/2021 1100 by Lurline Idol, RN Outcome: Adequate for Discharge   Problem: Nutrition: Goal: Adequate nutrition will be maintained 01/21/2021 1100 by Lurline Idol, RN Outcome: Adequate for  Discharge 01/21/2021 1100 by Lurline Idol, RN Outcome: Adequate for Discharge   Problem: Coping: Goal: Level of anxiety will decrease 01/21/2021 1100 by Lurline Idol, RN Outcome: Adequate for Discharge 01/21/2021 1100 by Lurline Idol, RN Outcome: Adequate for Discharge   Problem: Elimination: Goal: Will not experience complications related to bowel motility 01/21/2021 1100 by Lurline Idol, RN Outcome: Adequate for Discharge 01/21/2021 1100 by Lurline Idol, RN Outcome: Adequate for Discharge Goal: Will not experience complications related to urinary retention 01/21/2021 1100 by Lurline Idol, RN Outcome: Adequate for Discharge 01/21/2021 1100 by Lurline Idol, RN Outcome: Adequate for Discharge   Problem: Pain Managment: Goal: General experience of comfort will improve 01/21/2021 1100 by Lurline Idol, RN Outcome: Adequate for Discharge 01/21/2021 1100 by Lurline Idol, RN Outcome: Adequate for Discharge   Problem: Safety: Goal: Ability to remain free from injury will improve 01/21/2021 1100 by Lurline Idol, RN Outcome: Adequate for Discharge 01/21/2021 1100 by Lurline Idol, RN Outcome: Adequate for Discharge   Problem: Skin Integrity: Goal: Risk for impaired skin integrity will decrease 01/21/2021 1100 by Lurline Idol, RN Outcome: Adequate for Discharge 01/21/2021 1100 by Lurline Idol, RN Outcome: Adequate for Discharge

## 2021-01-21 NOTE — Progress Notes (Signed)
Patient given discharge instructions and stated understanding.  Patient was given mastectomy bag with all supplies needed.  Patients family is taking her home.

## 2021-01-21 NOTE — Discharge Summary (Signed)
Physician Discharge Summary  Patient ID: Lori Best MRN: [id number] DOB/AGE: 12/30/1980 40 y.o.  Admit date: 01/20/2021 Discharge date: 01/21/2021  Admission Diagnoses: Right breast dcis  Discharge Diagnoses:  Active Problems:   Breast neoplasm, Tis (DCIS), right   Discharged Condition: good  Hospital Course: 40 yof with right sided dcis and significant family history underwent bilateral mastectomies.  She tolerated this well and will be discharged.  Consults: None  Significant Diagnostic Studies: none  Treatments: surgery: bilateral mastectomies   Discharge Exam: Blood pressure (!) 142/99, pulse 68, temperature 99 F (37.2 C), temperature source Oral, resp. rate 18, height 5\' 2"  (1.575 m), weight 59 kg, SpO2 100 %. No hematoma, drains as expected  Disposition: Discharge disposition: 01-Home or Self Care        Allergies as of 01/21/2021      Reactions   Penicillins Hives   Has patient had a PCN reaction causing immediate rash, facial/tongue/throat swelling, SOB or lightheadedness with hypotension: No Has patient had a PCN reaction causing severe rash involving mucus membranes or skin necrosis: No Has patient had a PCN reaction that required hospitalization: No Has patient had a PCN reaction occurring within the last 10 years: No If all of the above answers are "NO", then may proceed with Cephalosporin use.      Medication List    TAKE these medications   Lysine 1000 MG Tabs Take 1,000 mg by mouth daily.   MAGNESIUM PO Take 20 mg by mouth daily.   meclizine 25 MG tablet Commonly known as: ANTIVERT Take 1 tablet (25 mg total) by mouth 3 (three) times daily as needed for dizziness.   multivitamin with minerals Tabs tablet Take 1 tablet by mouth daily.   propranolol 20 MG tablet Commonly known as: INDERAL Take 20 mg by mouth 2 (two) times daily.   vitamin C 1000 MG tablet Take 1,000 mg by mouth daily.   Vitamin D 125 MCG (5000 UT)  Caps Take 5,000 Units by mouth daily.   zinc gluconate 50 MG tablet Take 50 mg by mouth daily.       Follow-up Information    Rolm Bookbinder, MD In 2 weeks.   Specialty: General Surgery Contact information: [address] [place] [address] [phone]               Signed: Rolm Bookbinder 01/21/2021, 10:07 AM

## 2021-01-24 ENCOUNTER — Encounter: Payer: Self-pay | Admitting: *Deleted

## 2021-01-24 LAB — SURGICAL PATHOLOGY

## 2021-01-30 NOTE — Assessment & Plan Note (Signed)
01/20/21: Bilateral mastectomy Lori Best): Left breast: PASH with no malignancy Right breast: high grade DCIS, clear margins.ER 80%, PR 0%  Pathology counseling: I discussed the final pathology report of the patient provided  a copy of this report. We also discussed the final staging along with previously performed ER/PR  testing.  Plan: Since she had bilateral mastectomies, there is no role of further adjuvant therapy. RTC on an as needed basis

## 2021-01-30 NOTE — Progress Notes (Signed)
Patient Care Team: Cari Caraway, MD as PCP - General (Family Medicine) Mauro Kaufmann, RN as Oncology Nurse Navigator Rockwell Germany, RN as Oncology Nurse Navigator  DIAGNOSIS:    ICD-10-CM   1. Malignant neoplasm of upper-outer quadrant of right breast in female, estrogen receptor positive ([place])  C50.411    Z17.0     SUMMARY OF ONCOLOGIC HISTORY: Oncology History  Malignant neoplasm of upper-outer quadrant of right breast in female, estrogen receptor positive ([place])  12/10/2020 Cancer Staging   Staging form: Breast, AJCC 8th Edition - Clinical stage from 12/10/2020: Stage 0 (cTis (DCIS), cN0, cM0, ER+, PR-) - Signed by Gardenia Phlegm, NP on 12/22/2020 Stage prefix: Initial diagnosis   12/22/2020 Initial Diagnosis   Screening mammogram showed left breast calcifications. Diagnostic mammogram and US showed a 0.3cm group of calcifications in the upper outer right breast. Biopsy showed high grade DCIS, ER+ 80%, PR- 0%. Breast MRI on 12/17/20 showed the known DCIS in the right breast and no other evidence of carcinoma.    01/20/2021 Surgery   Bilateral mastectomy Donne Hazel): Left breast: PASH with no malignancy Right breast: high grade DCIS, clear margins.     CHIEF COMPLIANT: Follow-up s/p bilateral mastectomy   INTERVAL HISTORY: Lori Best is a 40 y.o. with above-mentioned history of right breast DCIS. She underwent bilateral mastectomy on 01/20/21 with Dr. Donne Hazel for which pathology showed in th left breast, PASH with no malignancy, and in the right breast, high grade DCIS, clear margins. She presents to the clinic today to review the pathology report and discuss further treatment.   ALLERGIES:  is allergic to penicillins.  MEDICATIONS:  Current Outpatient Medications  Medication Sig Dispense Refill  . Ascorbic Acid (VITAMIN C) 1000 MG tablet Take 1,000 mg by mouth daily.    . Cholecalciferol (VITAMIN D) 125 MCG (5000 UT) CAPS Take 5,000 Units by  mouth daily.    Marland Kitchen Lysine 1000 MG TABS Take 1,000 mg by mouth daily.    Marland Kitchen MAGNESIUM PO Take 20 mg by mouth daily.    . meclizine (ANTIVERT) 25 MG tablet Take 1 tablet (25 mg total) by mouth 3 (three) times daily as needed for dizziness. (Patient not taking: Reported on 01/11/2021) 30 tablet 0  . Multiple Vitamin (MULTIVITAMIN WITH MINERALS) TABS tablet Take 1 tablet by mouth daily.    . propranolol (INDERAL) 20 MG tablet Take 20 mg by mouth 2 (two) times daily.    Marland Kitchen zinc gluconate 50 MG tablet Take 50 mg by mouth daily.     No current facility-administered medications for this visit.    PHYSICAL EXAMINATION: ECOG PERFORMANCE STATUS: 1 - Symptomatic but completely ambulatory  There were no vitals filed for this visit. There were no vitals filed for this visit.  LABORATORY DATA:  I have reviewed the data as listed CMP Latest Ref Rng & Units 01/20/2021 02/23/2020 06/15/2017  Glucose 70 - 99 mg/dL 98 110(H) 95  BUN 6 - 20 mg/dL 13 14 9   Creatinine 0.44 - 1.00 mg/dL 0.73 0.75 0.64  Sodium 135 - 145 mmol/L 137 140 135  Potassium 3.5 - 5.1 mmol/L 4.0 3.5 3.9  Chloride 98 - 111 mmol/L 103 106 104  CO2 22 - 32 mmol/L 24 22 20(L)  Calcium 8.9 - 10.3 mg/dL 9.1 8.8(L) 9.0  Total Protein 6.5 - 8.1 g/dL - - 6.3(L)  Total Bilirubin 0.3 - 1.2 mg/dL - - 0.2(L)  Alkaline Phos 38 - 126 U/L - - 92  AST 15 - 41 U/L - - 31  ALT 14 - 54 U/L - - 19    Lab Results  Component Value Date   WBC 7.4 01/20/2021   HGB 13.2 01/20/2021   HCT 40.0 01/20/2021   MCV 99.5 01/20/2021   PLT 287 01/20/2021   NEUTROABS 2.6 02/23/2020    ASSESSMENT & PLAN:  Malignant neoplasm of upper-outer quadrant of right breast in female, estrogen receptor positive ([place]) 01/20/21: Bilateral mastectomy Donne Hazel): Left breast: [place] with no malignancy Right breast: high grade DCIS, clear margins.ER 80%, PR 0%  Pathology counseling: I discussed the final pathology report of the patient provided  a copy of this report. We also  discussed the final staging along with previously performed ER/PR  testing.  Plan: Since she had bilateral mastectomies, there is no role of further adjuvant therapy. I discussed with her that she does not need any scans or imaging studies for surveillance because she had bilateral mastectomies and given the fact that she did not have any evidence of invasive breast cancer. She is slightly uncomfortable with the idea of not having any imaging studies.  She understood the rationale. She will follow with Dr. Donne Hazel for annual checkups.  RTC on an as needed basis      No orders of the defined types were placed in this encounter.  The patient has a good understanding of the overall plan. she agrees with it. she will call with any problems that may develop before the next visit here.  Total time spent: 20 mins including face to face time and time spent for planning, charting and coordination of care  Rulon Eisenmenger, MD, MPH 01/31/2021  I, Cloyde Reams Dorshimer, am acting as scribe for Dr. Nicholas Lose.  I have reviewed the above documentation for accuracy and completeness, and I agree with the above.

## 2021-01-31 ENCOUNTER — Inpatient Hospital Stay: Payer: BC Managed Care – PPO | Attending: Hematology and Oncology | Admitting: Hematology and Oncology

## 2021-01-31 ENCOUNTER — Other Ambulatory Visit: Payer: Self-pay

## 2021-01-31 DIAGNOSIS — Z170 Estrogen receptor positive status [ER+]: Secondary | ICD-10-CM | POA: Diagnosis not present

## 2021-01-31 DIAGNOSIS — C50411 Malignant neoplasm of upper-outer quadrant of right female breast: Secondary | ICD-10-CM

## 2021-01-31 DIAGNOSIS — D0511 Intraductal carcinoma in situ of right breast: Secondary | ICD-10-CM | POA: Insufficient documentation

## 2021-02-25 ENCOUNTER — Ambulatory Visit: Payer: BC Managed Care – PPO | Attending: General Surgery | Admitting: Rehabilitation

## 2021-02-25 ENCOUNTER — Other Ambulatory Visit: Payer: Self-pay

## 2021-02-25 ENCOUNTER — Encounter: Payer: Self-pay | Admitting: Rehabilitation

## 2021-02-25 DIAGNOSIS — C50411 Malignant neoplasm of upper-outer quadrant of right female breast: Secondary | ICD-10-CM | POA: Insufficient documentation

## 2021-02-25 DIAGNOSIS — Z483 Aftercare following surgery for neoplasm: Secondary | ICD-10-CM

## 2021-02-25 DIAGNOSIS — Z170 Estrogen receptor positive status [ER+]: Secondary | ICD-10-CM | POA: Insufficient documentation

## 2021-02-25 NOTE — Therapy (Signed)
[place], [place], [address] Phone: [phone]   Fax:  [phone]  Physical Therapy Evaluation  Patient Details  Name: Lori Best MRN: [id number] Date of Birth: 09-17-1981 Referring Provider (PT): Dr. Donne Hazel   Encounter Date: 02/25/2021   PT End of Session - 02/25/21 0834    Visit Number 1    Number of Visits 1    PT Start Time 0801    PT Stop Time 0832    PT Time Calculation (min) 31 min    Activity Tolerance Patient tolerated treatment well    Behavior During Therapy [facility] for tasks assessed/performed           Past Medical History:  Diagnosis Date  . Cancer ([place])   . Family history of breast cancer 01/03/2021  . Family history of colon cancer 01/03/2021  . Family history of prostate cancer 01/03/2021  . Headache    migraines  . Hx of varicella   . Vaginal Pap smear, abnormal     Past Surgical History:  Procedure Laterality Date  . PILONIDAL CYST EXCISION  2004  . TOTAL MASTECTOMY Bilateral 01/20/2021   Procedure: BILATERAL MASTECTOMY;  Surgeon: Rolm Bookbinder, MD;  Location: [facility];  Service: General;  Laterality: Bilateral;    There were no vitals filed for this visit.    Subjective Assessment - 02/25/21 0804    Subjective No limitations.  Maybe tight with really reaching.  No swelling.  I am wearing a sports bra.    Pertinent History bilateral mastectomy on 01/20/21 with no lymph nodes removed and flat closure.   No radiation or chemotherapy.    Limitations Lifting    Patient Stated Goals check in with providers    Currently in Pain? No/denies              [facility] PT Assessment - 02/25/21 0001      Assessment   Medical Diagnosis Rt breast cancer; bil mastectomy    Referring Provider (PT) Dr. Donne Hazel    Hand Dominance Right    Prior Therapy no      Precautions   Precaution Comments non      Balance Screen   Has the patient fallen in the past 6 months  No    Has the patient had a decrease in activity level because of a fear of falling?  No    Is the patient reluctant to leave their home because of a fear of falling?  No      Home Ecologist residence    Living Arrangements Spouse/significant other;Children      Prior Function   Level of [place] [employer] at home    Leisure walk      Cognition   Overall Cognitive Status Within Functional Limits for tasks assessed      Observation/Other Assessments   Observations healing incisions all steristrips still present no edema noted      ROM / Strength   AROM / PROM / Strength AROM;PROM      AROM   AROM Assessment Site Shoulder    Right/Left Shoulder Right;Left    Right Shoulder Flexion 140 Degrees    Right Shoulder ABduction 150 Degrees    Right Shoulder Horizontal ABduction 17 Degrees    Right Shoulder Horizontal  ADduction 17 Degrees    Left Shoulder Flexion 140 Degrees    Left Shoulder ABduction 150 Degrees  PROM   PROM Assessment Site Shoulder    Right/Left Shoulder Right;Left    Right Shoulder Flexion 155 Degrees    Right Shoulder ABduction 155 Degrees    Right Shoulder External Rotation 86 Degrees    Right Shoulder Horizontal ABduction 25 Degrees    Left Shoulder Flexion 155 Degrees    Left Shoulder ABduction 155 Degrees    Left Shoulder External Rotation 85 Degrees    Left Shoulder Horizontal ABduction 25 Degrees             LYMPHEDEMA/ONCOLOGY QUESTIONNAIRE - 02/25/21 0001      Type   Cancer Type Rt breast      Surgeries   Mastectomy Date 01/20/21    Number Lymph Nodes Removed 0      Treatment   Active Chemotherapy Treatment No    Past Chemotherapy Treatment No    Active Radiation Treatment No    Past Radiation Treatment No    Current Hormone Treatment No    Past Hormone Therapy No                   Objective measurements completed on examination: See above findings.        [place] Adult PT Treatment/Exercise - 02/25/21 0001      Self-Care   Self-Care Other Self-Care Comments    Other Self-Care Comments  reviewed self scar massage when steristrips fall off and additional stretches to try per instruction section each performed x 2                  PT Education - 02/25/21 0834    Education Details scar massage and new exercises    Person(s) Educated Patient    Methods Explanation;Demonstration;Tactile cues;Verbal cues;Handout    Comprehension Verbalized understanding;Returned demonstration            PT Short Term Goals - 02/25/21 0837      PT SHORT TERM GOAL #1   Title Pt will be ind with HEP for continued mobility    Status Achieved                     Plan - 02/25/21 0834    Clinical Impression Statement Pt presents 5 weeks post bil mastectomy with no lymph nodes removed and no further treatment needed.  Discussed almost 0% chance of lymphedema, updated HEP, and discussed scar massage for home care.  Pt has slight decrease in active flexion and abduction but anticipate full ROM with continued stretches.  Pt will return after 8 weeks if full AROM has not returned.    Examination-Activity Limitations Reach Overhead    Examination-Participation Restrictions Yard Work    Stability/Clinical Decision Making Stable/Uncomplicated    Clinical Decision Making Low    Rehab Potential Excellent    PT Frequency One time visit    PT Treatment/Interventions ADLs/Self Care Home Management    PT Next Visit Plan check ROm if return    Consulted and Agree with Plan of Care Patient           Patient will benefit from skilled therapeutic intervention in order to improve the following deficits and impairments:  Decreased scar mobility,Impaired UE functional use  Visit Diagnosis: Malignant neoplasm of upper-outer quadrant of right breast in female, estrogen receptor positive [facility])  Aftercare following surgery for neoplasm     Problem  List Patient Active Problem List   Diagnosis Date Noted  . Breast neoplasm, Tis (DCIS), right 01/20/2021  .  Family history of breast cancer 01/03/2021  . Family history of prostate cancer 01/03/2021  . Family history of colon cancer 01/03/2021  . Genetic testing 01/03/2021  . Malignant neoplasm of upper-outer quadrant of right breast in female, estrogen receptor positive ([place]) 12/22/2020  . Pregnant 06/15/2017  . Vaginal delivery 08/22/2014  . Term pregnancy 08/21/2014    Stark Bray 02/25/2021, 8:38 AM  [place], [place], [address] Phone: [phone]   Fax:  [phone]  Name: Lori Best MRN: [id number] Date of Birth: Sep 13, 1981

## 2021-02-25 NOTE — Patient Instructions (Signed)
Access Code: [email or web address]: [email or web address]: 05/27/2022Prepared by: Marcene Brawn TevisExercises  Hooklying Rib Cage Breathing - 1-2 x daily - 7 x weekly  Supine Shoulder Flexion Extension AAROM with Dowel - 1-2 x daily - 7 x weekly - 10 reps - 5 seconds hold  Supine Chest Stretch with Elbows Bent - 1 x daily - 7 x weekly - 3 reps - 1 sets - 30-60seconds hold  Seated Lateral Trunk Stretch on Swiss Ball - 1 x daily - 7 x weekly - 1-3 sets - 10 reps - 20-30 seconds hold  Doorway Pec Stretch at 90 Degrees Abduction - 1 x daily - 7 x weekly - 1-3 sets - 10 reps - 20-30 seconds hold

## 2021-04-21 DIAGNOSIS — G43909 Migraine, unspecified, not intractable, without status migrainosus: Secondary | ICD-10-CM | POA: Diagnosis not present

## 2021-04-21 DIAGNOSIS — F411 Generalized anxiety disorder: Secondary | ICD-10-CM | POA: Diagnosis not present

## 2021-04-21 DIAGNOSIS — Z9013 Acquired absence of bilateral breasts and nipples: Secondary | ICD-10-CM | POA: Diagnosis not present

## 2021-06-07 DIAGNOSIS — Z23 Encounter for immunization: Secondary | ICD-10-CM | POA: Diagnosis not present

## 2021-07-14 DIAGNOSIS — Z803 Family history of malignant neoplasm of breast: Secondary | ICD-10-CM | POA: Diagnosis not present

## 2021-07-14 DIAGNOSIS — Z9013 Acquired absence of bilateral breasts and nipples: Secondary | ICD-10-CM | POA: Diagnosis not present

## 2021-07-14 DIAGNOSIS — D0511 Intraductal carcinoma in situ of right breast: Secondary | ICD-10-CM | POA: Diagnosis not present

## 2021-08-01 DIAGNOSIS — Z86000 Personal history of in-situ neoplasm of breast: Secondary | ICD-10-CM | POA: Diagnosis not present

## 2021-08-01 DIAGNOSIS — Z9013 Acquired absence of bilateral breasts and nipples: Secondary | ICD-10-CM | POA: Diagnosis not present

## 2021-08-29 DIAGNOSIS — Z803 Family history of malignant neoplasm of breast: Secondary | ICD-10-CM | POA: Diagnosis not present

## 2021-08-29 DIAGNOSIS — Z86000 Personal history of in-situ neoplasm of breast: Secondary | ICD-10-CM | POA: Diagnosis not present

## 2021-08-29 DIAGNOSIS — Z9013 Acquired absence of bilateral breasts and nipples: Secondary | ICD-10-CM | POA: Diagnosis not present

## 2021-10-10 DIAGNOSIS — Z9013 Acquired absence of bilateral breasts and nipples: Secondary | ICD-10-CM | POA: Diagnosis not present

## 2021-10-10 DIAGNOSIS — Z86000 Personal history of in-situ neoplasm of breast: Secondary | ICD-10-CM | POA: Diagnosis not present

## 2021-11-23 DIAGNOSIS — Z9013 Acquired absence of bilateral breasts and nipples: Secondary | ICD-10-CM | POA: Diagnosis not present

## 2021-11-23 DIAGNOSIS — Z803 Family history of malignant neoplasm of breast: Secondary | ICD-10-CM | POA: Diagnosis not present

## 2021-11-23 DIAGNOSIS — Z86000 Personal history of in-situ neoplasm of breast: Secondary | ICD-10-CM | POA: Diagnosis not present

## 2021-12-21 DIAGNOSIS — D225 Melanocytic nevi of trunk: Secondary | ICD-10-CM | POA: Diagnosis not present

## 2021-12-21 DIAGNOSIS — D485 Neoplasm of uncertain behavior of skin: Secondary | ICD-10-CM | POA: Diagnosis not present

## 2021-12-21 DIAGNOSIS — D0359 Melanoma in situ of other part of trunk: Secondary | ICD-10-CM | POA: Diagnosis not present

## 2021-12-21 DIAGNOSIS — D2362 Other benign neoplasm of skin of left upper limb, including shoulder: Secondary | ICD-10-CM | POA: Diagnosis not present

## 2022-01-04 DIAGNOSIS — D0359 Melanoma in situ of other part of trunk: Secondary | ICD-10-CM | POA: Diagnosis not present

## 2022-01-04 DIAGNOSIS — L988 Other specified disorders of the skin and subcutaneous tissue: Secondary | ICD-10-CM | POA: Diagnosis not present

## 2022-02-08 DIAGNOSIS — Z6823 Body mass index (BMI) 23.0-23.9, adult: Secondary | ICD-10-CM | POA: Diagnosis not present

## 2022-02-08 DIAGNOSIS — Z131 Encounter for screening for diabetes mellitus: Secondary | ICD-10-CM | POA: Diagnosis not present

## 2022-02-08 DIAGNOSIS — Z13220 Encounter for screening for lipoid disorders: Secondary | ICD-10-CM | POA: Diagnosis not present

## 2022-02-08 DIAGNOSIS — Z1151 Encounter for screening for human papillomavirus (HPV): Secondary | ICD-10-CM | POA: Diagnosis not present

## 2022-02-08 DIAGNOSIS — Z1321 Encounter for screening for nutritional disorder: Secondary | ICD-10-CM | POA: Diagnosis not present

## 2022-02-08 DIAGNOSIS — Z1329 Encounter for screening for other suspected endocrine disorder: Secondary | ICD-10-CM | POA: Diagnosis not present

## 2022-02-08 DIAGNOSIS — Z01419 Encounter for gynecological examination (general) (routine) without abnormal findings: Secondary | ICD-10-CM | POA: Diagnosis not present

## 2022-02-08 DIAGNOSIS — Z124 Encounter for screening for malignant neoplasm of cervix: Secondary | ICD-10-CM | POA: Diagnosis not present

## 2022-02-08 DIAGNOSIS — Z130 Encounter for screening for diseases of the blood and blood-forming organs and certain disorders involving the immune mechanism: Secondary | ICD-10-CM | POA: Diagnosis not present

## 2022-02-22 DIAGNOSIS — D0511 Intraductal carcinoma in situ of right breast: Secondary | ICD-10-CM | POA: Diagnosis not present

## 2022-05-31 DIAGNOSIS — R030 Elevated blood-pressure reading, without diagnosis of hypertension: Secondary | ICD-10-CM | POA: Diagnosis not present

## 2022-05-31 DIAGNOSIS — L500 Allergic urticaria: Secondary | ICD-10-CM | POA: Diagnosis not present

## 2022-05-31 DIAGNOSIS — L255 Unspecified contact dermatitis due to plants, except food: Secondary | ICD-10-CM | POA: Diagnosis not present

## 2022-06-19 DIAGNOSIS — D2361 Other benign neoplasm of skin of right upper limb, including shoulder: Secondary | ICD-10-CM | POA: Diagnosis not present

## 2022-06-19 DIAGNOSIS — D224 Melanocytic nevi of scalp and neck: Secondary | ICD-10-CM | POA: Diagnosis not present

## 2022-06-19 DIAGNOSIS — Z85820 Personal history of malignant melanoma of skin: Secondary | ICD-10-CM | POA: Diagnosis not present

## 2022-06-19 DIAGNOSIS — D225 Melanocytic nevi of trunk: Secondary | ICD-10-CM | POA: Diagnosis not present

## 2022-06-20 DIAGNOSIS — Z23 Encounter for immunization: Secondary | ICD-10-CM | POA: Diagnosis not present

## 2022-07-28 IMAGING — MG MM BREAST BX W/ LOC DEV 1ST LESION IMAGE BX SPEC STEREO GUIDE*R*
8 of 11 series · 8 of 15 positions shown · non-contrast
Comparison: Previous exams.
COMPARISON: Previous exams.

Addendum:
CLINICAL DATA: 39-year-old female presenting for stereotactic
biopsy of right breast calcifications.

EXAM:
RIGHT BREAST STEREOTACTIC CORE NEEDLE BIOPSY

[R (1 of 5)]
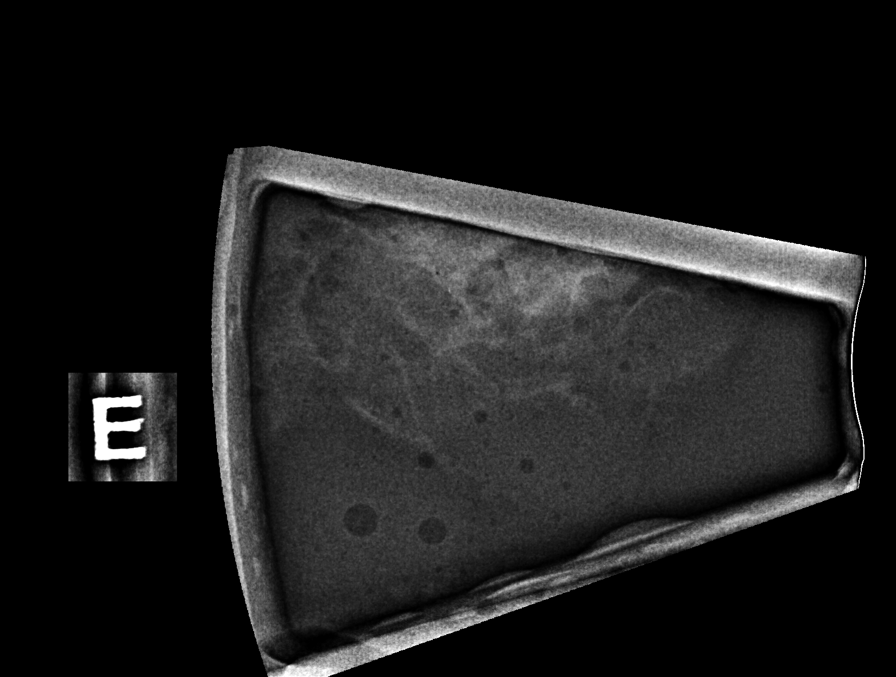

[R (2 of 5)]
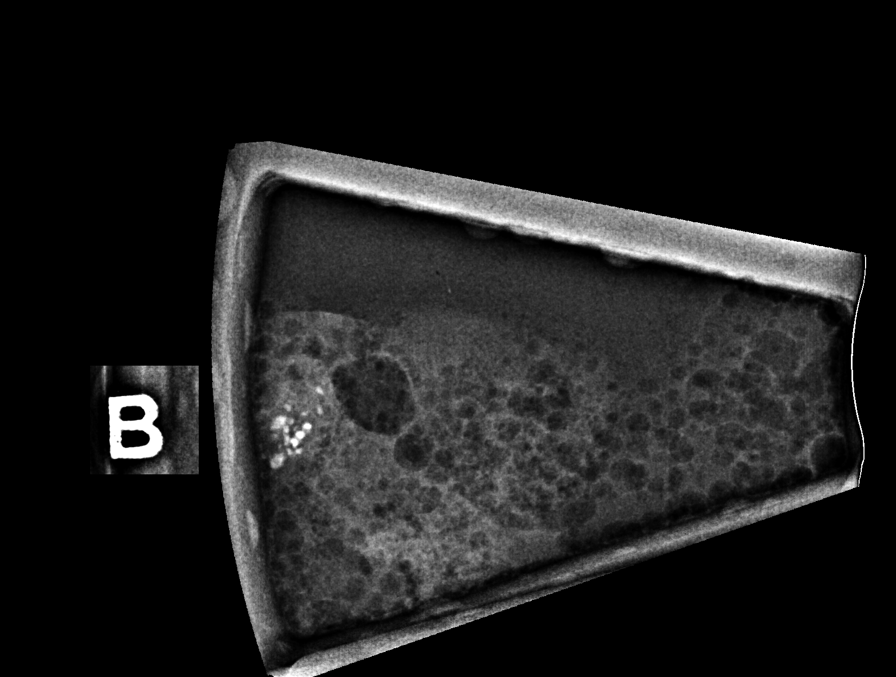

[R (3 of 5)]
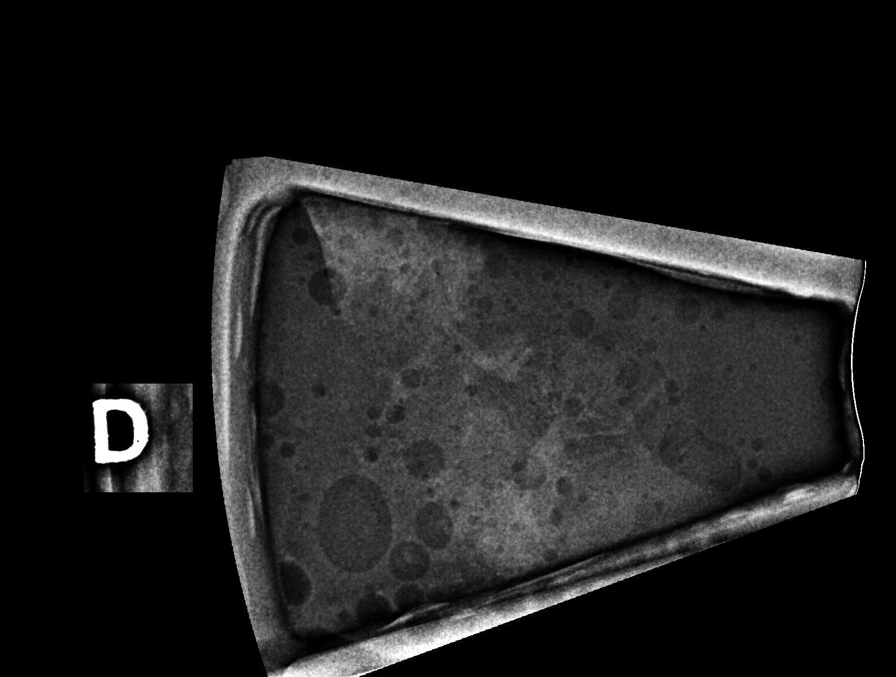

[R (4 of 5)]
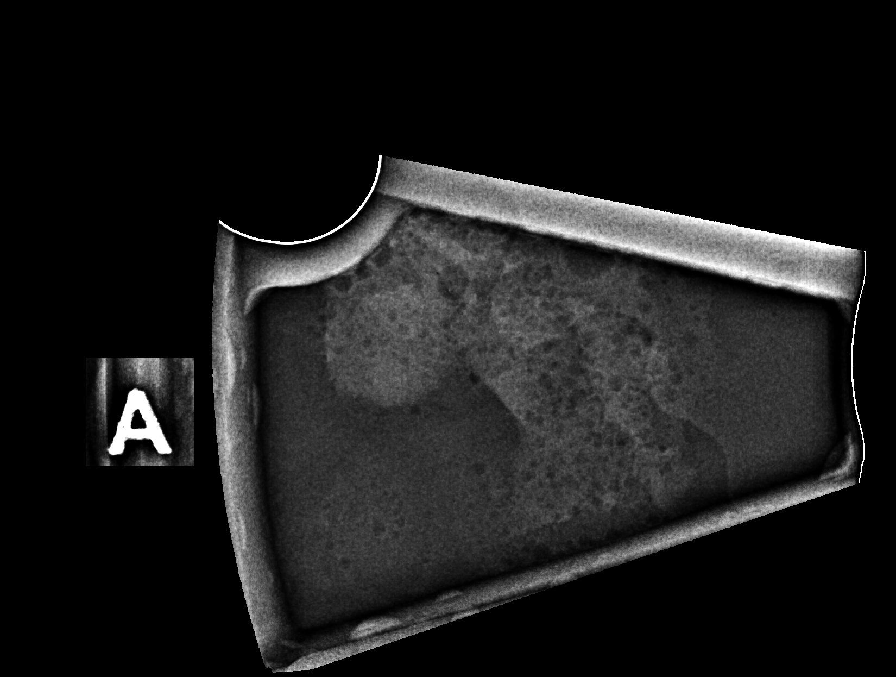

[R (5 of 5)]
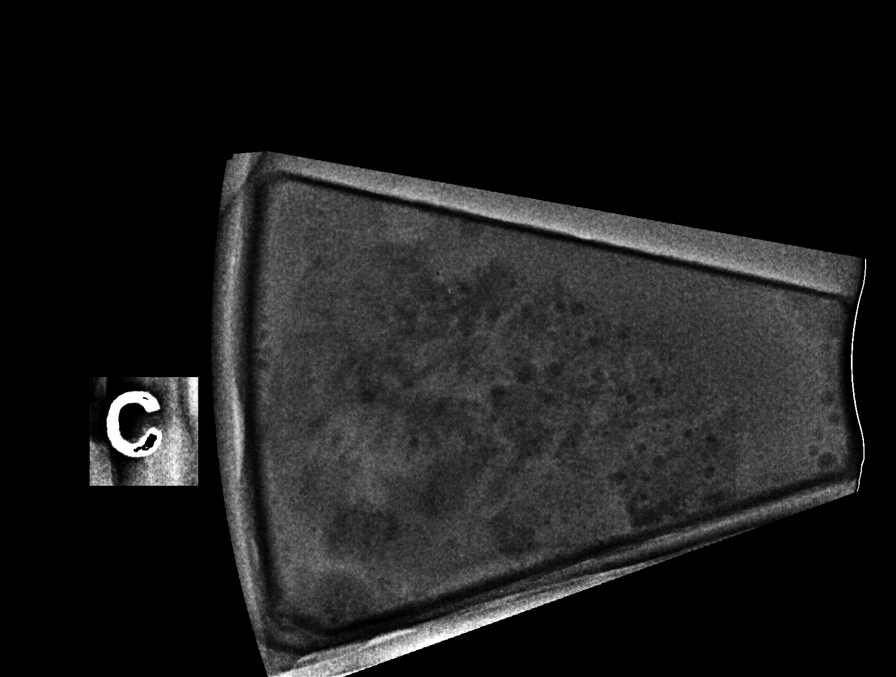

[R LM (1 of 3)]
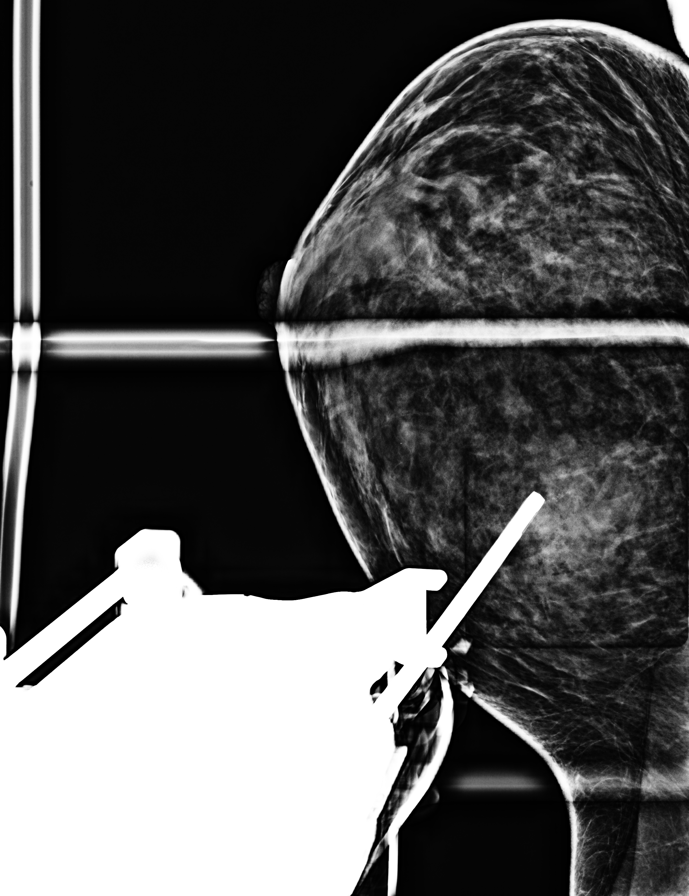

[R LM (2 of 3)]
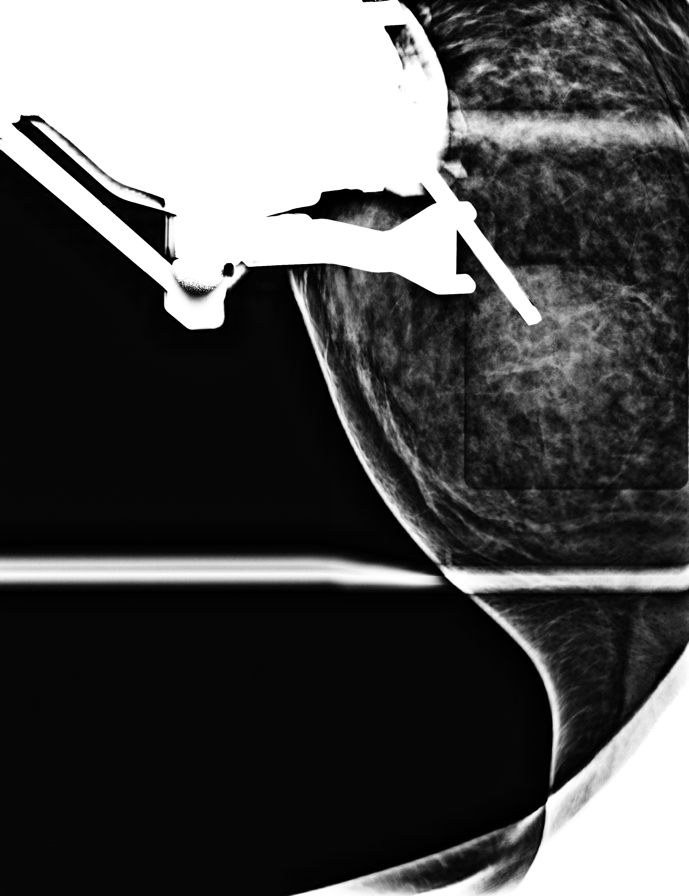

[R LM (3 of 3)]
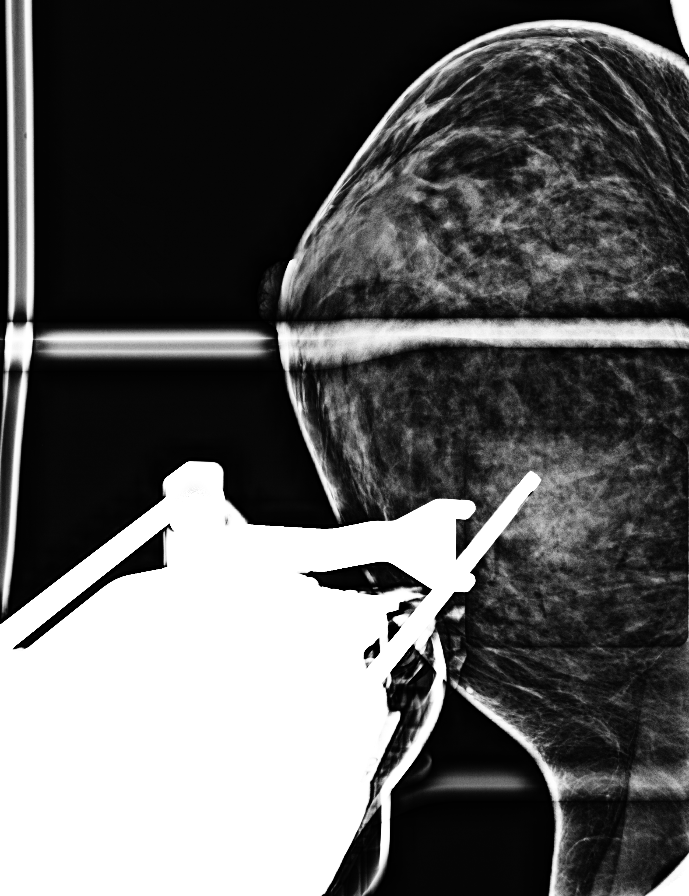

[8 of 15 positions shown; findings below may reference images not displayed]



Using sterile technique and 1% Lidocaine as local anesthetic, under
stereotactic guidance, a 9 gauge vacuum assisted device was used to
perform core needle biopsy of calcifications in the upper-outer
right breast using a lateral approach. Specimen radiograph was
performed showing calcifications clustered in 1 core sample.
Specimens with calcifications are identified for pathology.

Lesion quadrant: Upper outer quadrant

At the conclusion of the procedure, a coil shaped tissue marker clip
was deployed into the biopsy cavity. Follow-up 2-view mammogram was
performed and dictated separately.
IMPRESSION: Stereotactic-guided biopsy of calcifications in the upper-outer
right breast. No apparent complications.

ADDENDUM:
Pathology revealed HIGH GRADE DUCTAL CARCINOMA IN SITU WITH
CALCIFICATIONS AND NECROSIS of the RIGHT breast, upper outer. This
was found to be concordant by Dr. D J. Ram Rocha C.

Pathology results were discussed with the patient by telephone. The
patient reported doing well after the biopsy with tenderness at the
site. Post biopsy instructions and care were reviewed and questions
were answered. The patient was encouraged to call The [REDACTED]

Surgical consultation has been arranged with Dr. Raysa Mt at
[REDACTED] on December 21, 2020.

Recommend breast MRI given high grade, age, breast density and
family history.

Pathology results reported by Dorcoo Piedu RN on 12/13/2020.



Using sterile technique and 1% Lidocaine as local anesthetic, under
stereotactic guidance, a 9 gauge vacuum assisted device was used to
perform core needle biopsy of calcifications in the upper-outer
right breast using a lateral approach. Specimen radiograph was
performed showing calcifications clustered in 1 core sample.
Specimens with calcifications are identified for pathology.

Lesion quadrant: Upper outer quadrant

At the conclusion of the procedure, a coil shaped tissue marker clip
was deployed into the biopsy cavity. Follow-up 2-view mammogram was
performed and dictated separately.
IMPRESSION: Stereotactic-guided biopsy of calcifications in the upper-outer
right breast. No apparent complications.

## 2022-07-31 DIAGNOSIS — C50912 Malignant neoplasm of unspecified site of left female breast: Secondary | ICD-10-CM | POA: Diagnosis not present

## 2022-07-31 DIAGNOSIS — Z9011 Acquired absence of right breast and nipple: Secondary | ICD-10-CM | POA: Diagnosis not present

## 2022-09-11 DIAGNOSIS — Z111 Encounter for screening for respiratory tuberculosis: Secondary | ICD-10-CM | POA: Diagnosis not present

## 2022-09-11 DIAGNOSIS — Z23 Encounter for immunization: Secondary | ICD-10-CM | POA: Diagnosis not present

## 2022-09-11 DIAGNOSIS — Z0000 Encounter for general adult medical examination without abnormal findings: Secondary | ICD-10-CM | POA: Diagnosis not present

## 2022-09-14 DIAGNOSIS — Z130 Encounter for screening for diseases of the blood and blood-forming organs and certain disorders involving the immune mechanism: Secondary | ICD-10-CM | POA: Diagnosis not present

## 2022-09-14 DIAGNOSIS — E559 Vitamin D deficiency, unspecified: Secondary | ICD-10-CM | POA: Diagnosis not present

## 2022-09-14 DIAGNOSIS — Z79899 Other long term (current) drug therapy: Secondary | ICD-10-CM | POA: Diagnosis not present

## 2022-09-14 DIAGNOSIS — Z13228 Encounter for screening for other metabolic disorders: Secondary | ICD-10-CM | POA: Diagnosis not present

## 2022-09-14 DIAGNOSIS — Z13220 Encounter for screening for lipoid disorders: Secondary | ICD-10-CM | POA: Diagnosis not present

## 2023-12-20 DIAGNOSIS — D485 Neoplasm of uncertain behavior of skin: Secondary | ICD-10-CM | POA: Diagnosis not present

## 2023-12-20 DIAGNOSIS — D2261 Melanocytic nevi of right upper limb, including shoulder: Secondary | ICD-10-CM | POA: Diagnosis not present

## 2023-12-20 DIAGNOSIS — Z85820 Personal history of malignant melanoma of skin: Secondary | ICD-10-CM | POA: Diagnosis not present

## 2023-12-20 DIAGNOSIS — D2262 Melanocytic nevi of left upper limb, including shoulder: Secondary | ICD-10-CM | POA: Diagnosis not present

## 2023-12-20 DIAGNOSIS — D2362 Other benign neoplasm of skin of left upper limb, including shoulder: Secondary | ICD-10-CM | POA: Diagnosis not present

## 2023-12-20 DIAGNOSIS — D225 Melanocytic nevi of trunk: Secondary | ICD-10-CM | POA: Diagnosis not present

## 2024-02-18 DIAGNOSIS — Z124 Encounter for screening for malignant neoplasm of cervix: Secondary | ICD-10-CM | POA: Diagnosis not present

## 2024-02-18 DIAGNOSIS — Z01419 Encounter for gynecological examination (general) (routine) without abnormal findings: Secondary | ICD-10-CM | POA: Diagnosis not present

## 2024-02-18 DIAGNOSIS — Z6825 Body mass index (BMI) 25.0-25.9, adult: Secondary | ICD-10-CM | POA: Diagnosis not present

## 2024-02-18 DIAGNOSIS — Z1151 Encounter for screening for human papillomavirus (HPV): Secondary | ICD-10-CM | POA: Diagnosis not present

## 2024-03-05 DIAGNOSIS — D0511 Intraductal carcinoma in situ of right breast: Secondary | ICD-10-CM | POA: Diagnosis not present

## 2024-06-13 DIAGNOSIS — N926 Irregular menstruation, unspecified: Secondary | ICD-10-CM | POA: Diagnosis not present
